# Patient Record
Sex: Male | Born: 1970
Health system: Southern US, Community
[De-identification: ages and names within clinical notes are randomized; demographics above are authoritative.]

## PROBLEM LIST (undated history)

## (undated) DIAGNOSIS — Z8619 Personal history of other infectious and parasitic diseases: Secondary | ICD-10-CM

## (undated) DIAGNOSIS — K219 Gastro-esophageal reflux disease without esophagitis: Secondary | ICD-10-CM

## (undated) DIAGNOSIS — M539 Dorsopathy, unspecified: Secondary | ICD-10-CM

## (undated) DIAGNOSIS — L409 Psoriasis, unspecified: Secondary | ICD-10-CM

## (undated) DIAGNOSIS — T884XXA Failed or difficult intubation, initial encounter: Secondary | ICD-10-CM

## (undated) DIAGNOSIS — G4733 Obstructive sleep apnea (adult) (pediatric): Secondary | ICD-10-CM

## (undated) HISTORY — DX: Personal history of other infectious and parasitic diseases: Z86.19

## (undated) HISTORY — DX: Obstructive sleep apnea (adult) (pediatric): G47.33

## (undated) HISTORY — DX: Psoriasis, unspecified: L40.9

---

## 2005-05-30 DIAGNOSIS — K921 Melena: Secondary | ICD-10-CM | POA: Insufficient documentation

## 2015-06-29 DIAGNOSIS — L400 Psoriasis vulgaris: Secondary | ICD-10-CM | POA: Diagnosis not present

## 2015-10-05 ENCOUNTER — Ambulatory Visit (INDEPENDENT_AMBULATORY_CARE_PROVIDER_SITE_OTHER): Payer: BLUE CROSS/BLUE SHIELD | Admitting: Family Medicine

## 2015-10-05 ENCOUNTER — Encounter: Payer: Self-pay | Admitting: Family Medicine

## 2015-10-05 VITALS — BP 128/78 | HR 72 | Temp 98.0°F | Resp 16 | Wt 287.0 lb

## 2015-10-05 DIAGNOSIS — L409 Psoriasis, unspecified: Secondary | ICD-10-CM | POA: Insufficient documentation

## 2015-10-05 DIAGNOSIS — M7551 Bursitis of right shoulder: Secondary | ICD-10-CM

## 2015-10-05 DIAGNOSIS — G4733 Obstructive sleep apnea (adult) (pediatric): Secondary | ICD-10-CM | POA: Insufficient documentation

## 2015-10-05 DIAGNOSIS — E669 Obesity, unspecified: Secondary | ICD-10-CM | POA: Insufficient documentation

## 2015-10-05 DIAGNOSIS — Z720 Tobacco use: Secondary | ICD-10-CM | POA: Insufficient documentation

## 2015-10-05 MED ORDER — PREDNISONE 10 MG PO TABS: ORAL_TABLET | ORAL | 0 refills | Status: AC

## 2015-10-05 NOTE — Progress Notes (Signed)
Patient: Justin [name] Male    DOB: 24-Jan-1970   45 y.o.   MRN: [id number] Visit Date: 10/05/2015  Today's Provider: Mila Merryonald Karyna Bessler, MD   Chief Complaint  Patient presents with  . Shoulder Pain    right   Subjective:    HPI Shoulder Pain: Patient complaints of right shoulder pain. This is evaluated as a unknown injury. The pain is described as aching, numbing and tingling.  The onset of the pain was sudden not related to any injury.  The pain occurs continuously.  Location is right shoulder. No history of dislocation. Symptoms are aggravated by no limitations. Symptoms are diminished by 800mg   Ibuprofen and Bio Whitman HeroFreez.   Limited activities include: no limitations. No stiffness is reported. 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 Has been hurting for 2 months. Works at [employer] but not doing any more lifting than usually.  He reports history of psoriasis which ha also flared up the last several weeks.        Allergies  Allergen Reactions  . Penicillins      Current Outpatient Prescriptions:  .  fluticasone (FLONASE) 50 MCG/ACT nasal spray, Place 2 sprays into both nostrils daily., Disp: , Rfl: 10 .  ibuprofen (ADVIL,MOTRIN) 200 MG tablet, Take 200 mg by mouth every 6 (six) hours as needed., Disp: , Rfl:   Review of Systems  Constitutional: Negative.  Negative for appetite change, chills and fever.  Respiratory: Negative for chest tightness, shortness of breath and wheezing.   Cardiovascular: Negative.  Negative for chest pain and palpitations.  Gastrointestinal: Negative for abdominal pain, nausea and vomiting.  Musculoskeletal: Positive for myalgias and neck pain. Negative for arthralgias and joint swelling.    Social History  Substance Use Topics  . Smoking status: Current Every Day Smoker    Packs/day: 1.00    Years: 16.00  . Smokeless tobacco: Never Used  . Alcohol use Yes     Comment: occasionally drinks liquor   Objective:   BP  128/78 (BP Location: Left Arm, Patient Position: Sitting, Cuff Size: Large)   Pulse 72   Temp 98 F (36.7 C) (Oral)   Resp 16   Wt 287 lb (130.2 kg)   SpO2 96%   Physical Exam   General Appearance:    Alert, cooperative, no distress  Eyes:    PERRL, conjunctiva/corneas clear, EOM's intact       Lungs:     Clear to auscultation bilaterally, respirations unlabored  Heart:    Regular rate and rhythm  Neurologic:   Awake, alert, oriented x 3. No apparent focal neurological           defect.   MS:   Diffusely tender over right shoulder lateral aspect of right upper arm and upper scapula. FROM neck and shoulder. +5/5 UE strength. Negative impingement sign.        Assessment & Plan:     1. Bursitis, shoulder, right Failed NSAIDs. Consider orthopedic referral if not improving in a few days.  - predniSONE (DELTASONE) 10 MG tablet; 6 tablets for 2 days, then 5 for 2 days, then 4 for 2 days, then 3 for 2 days, then 2 for 2 days, then 1 for 2 days.  Dispense: 42 tablet; Refill: 0     The entirety of the information documented in the History of Present Illness, Review of Systems and Physical Exam were personally obtained by me. Portions of this  information were initially documented by Rondel Baton, CMA and reviewed by me for thoroughness and accuracy.    Mila Merry, MD  [facility]

## 2015-11-30 DIAGNOSIS — L400 Psoriasis vulgaris: Secondary | ICD-10-CM | POA: Diagnosis not present

## 2016-09-20 ENCOUNTER — Ambulatory Visit (INDEPENDENT_AMBULATORY_CARE_PROVIDER_SITE_OTHER): Payer: BLUE CROSS/BLUE SHIELD | Admitting: Family Medicine

## 2016-09-20 ENCOUNTER — Encounter: Payer: Self-pay | Admitting: Family Medicine

## 2016-09-20 VITALS — BP 144/82 | HR 100 | Temp 98.5°F | Resp 16 | Wt 305.0 lb

## 2016-09-20 DIAGNOSIS — Z720 Tobacco use: Secondary | ICD-10-CM | POA: Diagnosis not present

## 2016-09-20 DIAGNOSIS — J029 Acute pharyngitis, unspecified: Secondary | ICD-10-CM | POA: Diagnosis not present

## 2016-09-20 DIAGNOSIS — J039 Acute tonsillitis, unspecified: Secondary | ICD-10-CM

## 2016-09-20 LAB — POCT RAPID STREP A (OFFICE): Rapid Strep A Screen: NEGATIVE

## 2016-09-20 MED ORDER — LEVOFLOXACIN 500 MG PO TABS: 500.0000 mg | ORAL_TABLET | Freq: Every day | ORAL | 0 refills | Status: DC

## 2016-09-20 NOTE — Progress Notes (Signed)
Note for wokrkprovided.

## 2016-09-20 NOTE — Progress Notes (Signed)
Justin [name]  MRN: [id number] DOB: [birth year]  Subjective:  HPI  Patient is here to discuss sore throat. Patient states he was feeling fine until last night. He woke up with sore/irritated throat. Lymph glands feel swollen, hurts to swallow solids but not so much with liquids. Saw some white spots in the back of his throat. No fever. No ear pain. He uses Flonase. Patient Active Problem List   Diagnosis Date Noted  . Adiposity 10/05/2015  . Obstructive apnea 10/05/2015  . Psoriasis 10/05/2015  . Current tobacco use 10/05/2015  . Blood in feces 05/30/2005    Past Medical History:  Diagnosis Date  . History of chickenpox   . Obstructive sleep apnea   . Psoriasis     Social History   Social History  . Marital status: Divorced    Spouse name: N/A  . Number of children: 1  . Years of education: N/A   Occupational History  . Not on file.   Social History Main Topics  . Smoking status: Current Every Day Smoker    Packs/day: 0.50    Years: 16.00    Types: Cigarettes  . Smokeless tobacco: Never Used  . Alcohol use No  . Drug use: No  . Sexual activity: Not on file   Other Topics Concern  . Not on file   Social History Narrative  . No narrative on file    Outpatient Encounter Prescriptions as of 09/20/2016  Medication Sig Note  . fluticasone (FLONASE) 50 MCG/ACT nasal spray Place 2 sprays into both nostrils daily. 10/05/2015: Received from: External Pharmacy  . ibuprofen (ADVIL,MOTRIN) 200 MG tablet Take 200 mg by mouth every 6 (six) hours as needed.    No facility-administered encounter medications on file as of 09/20/2016.     Allergies  Allergen Reactions  . Penicillins     Review of Systems  Constitutional: Negative.   HENT: Positive for sore throat.   Eyes: Negative.   Respiratory: Negative.   Cardiovascular: Negative.   Gastrointestinal: Negative.   Skin: Negative.   Neurological: Negative.   Endo/Heme/Allergies: Negative.     Psychiatric/Behavioral: Negative.     Objective:  BP (!) 144/82   Pulse 100   Temp 98.5 F (36.9 C)   Resp 16   Wt (!) 305 lb (138.3 kg)   Physical Exam  Constitutional: He is oriented to person, place, and time and well-developed, well-nourished, and in no distress.  HENT:  Head: Normocephalic and atraumatic.  Right Ear: External ear normal.  Left Ear: External ear normal.  Nose: Nose normal.  Mouth/Throat: Oropharyngeal exudate present.  Tonsils 3+  Cardiovascular: Normal rate, regular rhythm, normal heart sounds and intact distal pulses.  Exam reveals no gallop.   No murmur heard. Pulmonary/Chest: Effort normal and breath sounds normal. No respiratory distress. He has no wheezes.  Abdominal: Soft.  Neurological: He is alert and oriented to person, place, and time. Coordination abnormal.  Skin: Skin is warm and dry.  Psychiatric: Mood, memory, affect and judgment normal.   Assessment and Plan :  1. Tonsillitis Treat with Levaquin. Advised patient to get in touch with ENT and re disucss removal of tonsils.follow as needed. - levofloxacin (LEVAQUIN) 500 MG tablet; Take 1 tablet (500 mg total) by mouth daily.  Dispense: 5 tablet; Refill: 0 Strep is negative . Refer back to ENT. PCN allergic pt. 2. Current tobacco use  3. Sore throat Strep is negative. - POCT rapid strep A  HPI, Exam and A&P  transcribed by Domingo CockingAnastasiya Hopkins, RMA under direction and in the presence of Julieanne Mansonichard Cantrell Martus, MD. I have done the exam and reviewed the chart and it is accurate to the best of my knowledge. Dentist[employer]agon  technology has been used and  any errors in dictation or transcription are unintentional. Julieanne Mansonichard Aunna Snooks M.D. [facility]

## 2016-09-21 ENCOUNTER — Ambulatory Visit: Payer: BLUE CROSS/BLUE SHIELD | Admitting: Family Medicine

## 2016-11-15 DIAGNOSIS — R05 Cough: Secondary | ICD-10-CM | POA: Diagnosis not present

## 2016-11-15 DIAGNOSIS — J3501 Chronic tonsillitis: Secondary | ICD-10-CM | POA: Diagnosis not present

## 2016-11-15 DIAGNOSIS — G4733 Obstructive sleep apnea (adult) (pediatric): Secondary | ICD-10-CM | POA: Diagnosis not present

## 2016-11-15 DIAGNOSIS — J0141 Acute recurrent pansinusitis: Secondary | ICD-10-CM | POA: Diagnosis not present

## 2017-02-13 DIAGNOSIS — L400 Psoriasis vulgaris: Secondary | ICD-10-CM | POA: Diagnosis not present

## 2017-02-28 ENCOUNTER — Ambulatory Visit (INDEPENDENT_AMBULATORY_CARE_PROVIDER_SITE_OTHER): Payer: BLUE CROSS/BLUE SHIELD | Admitting: Family Medicine

## 2017-02-28 ENCOUNTER — Encounter: Payer: Self-pay | Admitting: Family Medicine

## 2017-02-28 VITALS — BP 128/78 | HR 79 | Temp 98.7°F | Resp 16 | Wt 308.0 lb

## 2017-02-28 DIAGNOSIS — Z720 Tobacco use: Secondary | ICD-10-CM | POA: Diagnosis not present

## 2017-02-28 DIAGNOSIS — J0110 Acute frontal sinusitis, unspecified: Secondary | ICD-10-CM

## 2017-02-28 MED ORDER — VARENICLINE TARTRATE 0.5 MG X 11 & 1 MG X 42 PO MISC: ORAL | 0 refills | Status: AC

## 2017-02-28 MED ORDER — VARENICLINE TARTRATE 1 MG PO TABS: 1.0000 mg | ORAL_TABLET | Freq: Two times a day (BID) | ORAL | 2 refills | Status: DC

## 2017-02-28 MED ORDER — AMOXICILLIN 500 MG PO CAPS: 1000.0000 mg | ORAL_CAPSULE | Freq: Two times a day (BID) | ORAL | 0 refills | Status: AC

## 2017-02-28 NOTE — Progress Notes (Signed)
Patient: Justin [name] Male    DOB: 11/08/1970   47 y.o.   MRN: [id number] Visit Date: 02/28/2017  Today's Provider: Mila Merry, MD   Chief Complaint  Patient presents with  . URI   Subjective:    URI   This is a new problem. Episode onset: 3 days ago. The problem has been unchanged. There has been no fever. Associated symptoms include congestion (nasal congestion), coughing (productive with clear- green phlegm), headaches, rhinorrhea, sinus pain, sneezing and vomiting (last night). Pertinent negatives include no abdominal pain, chest pain, diarrhea, dysuria, ear pain or sore throat. Treatments tried: Nasal rinse and Mucinex. The treatment provided no relief.   He would also like to try medications to help stop smoking. He tried nicotine gum which he states caused hiccups. He smokes 1- 1/2 packs every day. Is interested in chantix.      Allergies  Allergen Reactions  . Penicillins      Current Outpatient Medications:  .  fluticasone (FLONASE) 50 MCG/ACT nasal spray, Place 2 sprays into both nostrils daily., Disp: , Rfl: 10 .  ibuprofen (ADVIL,MOTRIN) 200 MG tablet, Take 200 mg by mouth every 6 (six) hours as needed., Disp: , Rfl:   Review of Systems  Constitutional: Negative for chills and diaphoresis.  HENT: Positive for congestion (nasal congestion), postnasal drip, rhinorrhea, sinus pressure, sinus pain and sneezing. Negative for ear pain and sore throat.   Eyes: Positive for discharge.  Respiratory: Positive for cough (productive with clear- green phlegm).   Cardiovascular: Negative for chest pain.  Gastrointestinal: Positive for vomiting (last night). Negative for abdominal pain and diarrhea.  Genitourinary: Negative for dysuria.  Neurological: Positive for headaches.    Social History   Tobacco Use  . Smoking status: Current Every Day Smoker    Packs/day: 0.50    Years: 16.00    Pack years: 8.00    Types: Cigarettes  . Smokeless tobacco: Never  Used  Substance Use Topics  . Alcohol use: No   Objective:   BP 128/78 (BP Location: Right Arm, Patient Position: Sitting, Cuff Size: Large)   Pulse 79   Temp 98.7 F (37.1 C) (Oral)   Resp 16   Wt (!) 308 lb (139.7 kg)   SpO2 95% Comment: room air There were no vitals filed for this visit.   Physical Exam  General Appearance:    Alert, cooperative, no distress  HENT:   bilateral TM normal without fluid or infection, neck without nodes, frontal sinus tender and nasal mucosa congested  Eyes:    PERRL, conjunctiva/corneas clear, EOM's intact       Lungs:     Clear to auscultation bilaterally, respirations unlabored  Heart:    Regular rate and rhythm  Neurologic:   Awake, alert, oriented x 3. No apparent focal neurological           defect.           Assessment & Plan:     1. Acute frontal sinusitis, recurrence not specified  - augmented betamethasone dipropionate (DIPROLENE-AF) 0.05 % ointment; Apply twice daily to affected areas; Refill: 2 - amoxicillin (AMOXIL) 500 MG capsule; Take 2 capsules (1,000 mg total) by mouth 2 (two) times daily for 7 days.  Dispense: 28 capsule; Refill: 0  2. Current tobacco use Counseled regarding smoking cessation. He would like to try Chantix which was prescribed.  - varenicline (CHANTIX STARTING MONTH PAK) 0.5 MG X 11 & 1 MG X  42 tablet; Take one 0.5 mg tablet daily for 3 days, then take one 0.5 mg tablet twice daily for 4 days, then take one 1 mg tablet twice daily  Dispense: 53 tablet; Refill: 0 - varenicline (CHANTIX CONTINUING MONTH PAK) 1 MG tablet; Take 1 tablet (1 mg total) by mouth 2 (two) times daily.  Dispense: 60 tablet; Refill: 2       Mila Merryonald Zara Wendt, MD  [facility]

## 2017-03-06 DIAGNOSIS — M1990 Unspecified osteoarthritis, unspecified site: Secondary | ICD-10-CM | POA: Diagnosis not present

## 2017-03-06 DIAGNOSIS — L409 Psoriasis, unspecified: Secondary | ICD-10-CM | POA: Diagnosis not present

## 2017-03-06 DIAGNOSIS — E669 Obesity, unspecified: Secondary | ICD-10-CM | POA: Diagnosis not present

## 2017-03-28 ENCOUNTER — Other Ambulatory Visit: Payer: Self-pay | Admitting: Family Medicine

## 2017-03-28 DIAGNOSIS — Z720 Tobacco use: Secondary | ICD-10-CM

## 2017-03-29 NOTE — Telephone Encounter (Signed)
Please contact pharmacy about refill request for chantix starting pack. He was prescribed starting pack on 02-28-2017. Has he taken that? If so then he will need prescription for a continuing pack, not a starting pack.

## 2017-04-03 MED ORDER — VARENICLINE TARTRATE 1 MG PO TABS: 1.0000 mg | ORAL_TABLET | Freq: Two times a day (BID) | ORAL | 2 refills | Status: DC

## 2017-04-03 NOTE — Telephone Encounter (Signed)
Called the pharmacy and the patient picked up the Rx on 03/01/17.

## 2017-04-06 ENCOUNTER — Encounter: Payer: Self-pay | Admitting: Family Medicine

## 2017-04-06 ENCOUNTER — Ambulatory Visit
Admission: RE | Admit: 2017-04-06 | Discharge: 2017-04-06 | Disposition: A | Discharge status: Home / Self Care | Payer: BLUE CROSS/BLUE SHIELD | Source: Ambulatory Visit | Attending: Family Medicine | Admitting: Family Medicine

## 2017-04-06 ENCOUNTER — Ambulatory Visit: Payer: BLUE CROSS/BLUE SHIELD | Admitting: Family Medicine

## 2017-04-06 VITALS — BP 134/86 | HR 72 | Temp 98.0°F | Resp 16 | Wt 309.6 lb

## 2017-04-06 DIAGNOSIS — M47892 Other spondylosis, cervical region: Secondary | ICD-10-CM | POA: Diagnosis not present

## 2017-04-06 DIAGNOSIS — M79602 Pain in left arm: Secondary | ICD-10-CM

## 2017-04-06 DIAGNOSIS — M542 Cervicalgia: Secondary | ICD-10-CM | POA: Diagnosis not present

## 2017-04-06 MED ORDER — PREDNISONE 10 MG PO TABS: ORAL_TABLET | ORAL | 0 refills | Status: DC

## 2017-04-06 NOTE — Progress Notes (Signed)
Patient: Justin [name] Male    DOB: 1970-10-05   46 y.o.   MRN: [id number] Visit Date: 04/06/2017  Today's Provider: Megan Mansichard Chastin Riesgo Jr, MD   Chief Complaint  Patient presents with  . Shoulder Pain   Subjective:    Shoulder Pain   The pain is present in the left shoulder. The current episode started in the past 7 days. There has been no history of extremity trauma. The problem occurs constantly. Quality: shooting. The pain is at a severity of 10/10. Associated symptoms include a limited range of motion, numbness and tingling. Pertinent negatives include no fever, inability to bear weight, itching, joint locking, joint swelling or stiffness. The symptoms are aggravated by activity and lying down. Treatments tried: Meloxicam. The treatment provided mild relief. There is no history of diabetes, gout, osteoarthritis or rheumatoid arthritis.       Allergies  Allergen Reactions  . Penicillins      Current Outpatient Medications:  .  augmented betamethasone dipropionate (DIPROLENE-AF) 0.05 % ointment, Apply twice daily to affected areas, Disp: , Rfl: 2 .  fluticasone (FLONASE) 50 MCG/ACT nasal spray, Place 2 sprays into both nostrils daily., Disp: , Rfl: 10 .  ibuprofen (ADVIL,MOTRIN) 200 MG tablet, Take 200 mg by mouth every 6 (six) hours as needed., Disp: , Rfl:  .  meloxicam (MOBIC) 7.5 MG tablet, TAKE 1 TABLET BY MOUTH DAILY IF NEEDED. TAKE WITH FOOD, Disp: , Rfl: 2 .  varenicline (CHANTIX CONTINUING MONTH PAK) 1 MG tablet, Take 1 tablet (1 mg total) by mouth 2 (two) times daily., Disp: 60 tablet, Rfl: 2  Review of Systems  Constitutional: Negative for fever.  HENT: Negative.   Eyes: Negative.   Respiratory: Negative.   Cardiovascular: Negative.   Gastrointestinal: Negative.   Endocrine: Negative.   Musculoskeletal: Negative for gout and stiffness.  Skin: Negative for itching.  Allergic/Immunologic: Negative.   Neurological: Positive for tingling and numbness.    Hematological: Negative.   Psychiatric/Behavioral: Negative.     Social History   Tobacco Use  . Smoking status: Current Every Day Smoker    Packs/day: 0.50    Years: 16.00    Pack years: 8.00    Types: Cigarettes  . Smokeless tobacco: Never Used  Substance Use Topics  . Alcohol use: No   Objective:   BP 134/86   Pulse 72   Temp 98 F (36.7 C) (Oral)   Resp 16   Wt (!) 309 lb 9.6 oz (140.4 kg)   SpO2 96%  Vitals:   04/06/17 1059  BP: 134/86  Pulse: 72  Resp: 16  Temp: 98 F (36.7 C)  TempSrc: Oral  SpO2: 96%  Weight: (!) 309 lb 9.6 oz (140.4 kg)     Physical Exam  Constitutional: He is oriented to person, place, and time. He appears well-developed and well-nourished.  HENT:  Head: Normocephalic and atraumatic.  Eyes: Conjunctivae are normal. No scleral icterus.  Neck: No thyromegaly present.  Cardiovascular: Normal rate, regular rhythm and normal heart sounds.  Pulmonary/Chest: Effort normal and breath sounds normal.  Musculoskeletal: He exhibits tenderness.  Mild tenderness over left anterior shoulder. Pain with abduction beyond 90 degrees.  Neurological: He is alert and oriented to person, place, and time. He exhibits normal muscle tone. Coordination normal.  Skin: Skin is warm and dry.  Psychiatric: He has a normal mood and affect. His behavior is normal. Judgment and thought content normal.  Assessment & Plan:     1. Pain of left upper extremity Arthropathy/tendonitis/impingement. May need ortho referral if not improving. - EKG 12-Lead - DG Cervical Spine Complete; Future - Ambulatory referral to Orthopedic Surgery      I have done the exam and reviewed the above chart and it is accurate to the best of my knowledge. Dentist has been used in this note in any air is in the dictation or transcription are unintentional.  Megan Mans, MD  [facility]

## 2017-04-07 ENCOUNTER — Telehealth: Payer: Self-pay

## 2017-04-07 NOTE — Telephone Encounter (Signed)
Pt advised.

## 2017-04-07 NOTE — Telephone Encounter (Signed)
-----   Message from Maple Hudsonichard L Gilbert Jr., MD sent at 04/07/2017  2:26 PM EDT ----- Mild DDD of neck.

## 2017-04-11 ENCOUNTER — Telehealth: Payer: Self-pay

## 2017-04-11 NOTE — Telephone Encounter (Signed)
He probably needs f/u OV. I thought it looked more shoulder than cervical DDD last visit but could be DDD. May need MRI.

## 2017-04-11 NOTE — Telephone Encounter (Signed)
Patient called saying that he still has the numbness and pain in his arm. He was seen in the office last week, and reports that he took his last dose of prednisone today. He doesn't feel that the prednisone helped much. He is requesting another round or something different to be sent into the pharmacy. He would like [facility] [place]. Thanks!

## 2017-04-11 NOTE — Telephone Encounter (Signed)
Please review

## 2017-04-11 NOTE — Telephone Encounter (Signed)
Sorry Dr Sherrie MustacheFisher, that was supposed to go to Dr Sullivan LoneGilbert.

## 2017-04-11 NOTE — Telephone Encounter (Signed)
ok 

## 2017-04-12 NOTE — Telephone Encounter (Signed)
Patient scheduled ov for 04/13/2017 at 2:00 pm.

## 2017-04-13 ENCOUNTER — Encounter: Payer: Self-pay | Admitting: Family Medicine

## 2017-04-13 ENCOUNTER — Ambulatory Visit (INDEPENDENT_AMBULATORY_CARE_PROVIDER_SITE_OTHER): Payer: BLUE CROSS/BLUE SHIELD | Admitting: Family Medicine

## 2017-04-13 VITALS — BP 140/68 | HR 69 | Temp 98.0°F | Resp 16 | Ht 62.0 in | Wt 308.0 lb

## 2017-04-13 DIAGNOSIS — M5030 Other cervical disc degeneration, unspecified cervical region: Secondary | ICD-10-CM

## 2017-04-13 DIAGNOSIS — M5412 Radiculopathy, cervical region: Secondary | ICD-10-CM | POA: Diagnosis not present

## 2017-04-13 DIAGNOSIS — Z87821 Personal history of retained foreign body fully removed: Secondary | ICD-10-CM | POA: Diagnosis not present

## 2017-04-13 DIAGNOSIS — M19012 Primary osteoarthritis, left shoulder: Secondary | ICD-10-CM

## 2017-04-13 NOTE — Progress Notes (Signed)
Patient: Justin GrillJamie L Vinciguerra Male    DOB: 17-Mar-1970   46 y.o.   MRN: [id number] Visit Date: 04/13/2017  Today's Provider: Megan Mansichard Emmalin Jaquess Jr, MD   Chief Complaint  Patient presents with  . Follow-up   Subjective:    HPI  Pain of left upper extremity From 04/06/2017-Arthropathy/tendonitis/impingement. May need ortho referral if not improving. Patient called office on 04/11/2017 stating he was still having numbness and pain in left arm. Patient was advised to office visit and possible MRI.     Allergies  Allergen Reactions  . Penicillins      Current Outpatient Medications:  .  augmented betamethasone dipropionate (DIPROLENE-AF) 0.05 % ointment, Apply twice daily to affected areas, Disp: , Rfl: 2 .  fluticasone (FLONASE) 50 MCG/ACT nasal spray, Place 2 sprays into both nostrils daily., Disp: , Rfl: 10 .  ibuprofen (ADVIL,MOTRIN) 200 MG tablet, Take 200 mg by mouth every 6 (six) hours as needed., Disp: , Rfl:  .  meloxicam (MOBIC) 7.5 MG tablet, TAKE 1 TABLET BY MOUTH DAILY IF NEEDED. TAKE WITH FOOD, Disp: , Rfl: 2 .  varenicline (CHANTIX CONTINUING MONTH PAK) 1 MG tablet, Take 1 tablet (1 mg total) by mouth 2 (two) times daily., Disp: 60 tablet, Rfl: 2 .  predniSONE (DELTASONE) 10 MG tablet, Take as directed (Patient not taking: Reported on 04/13/2017), Disp: 21 tablet, Rfl: 0  Review of Systems  Constitutional: Negative for appetite change, chills and fever.  Respiratory: Negative for chest tightness, shortness of breath and wheezing.   Cardiovascular: Negative for chest pain and palpitations.  Gastrointestinal: Negative for abdominal pain, nausea and vomiting.    Social History   Tobacco Use  . Smoking status: Current Every Day Smoker    Packs/day: 0.50    Years: 16.00    Pack years: 8.00    Types: Cigarettes  . Smokeless tobacco: Never Used  Substance Use Topics  . Alcohol use: No   Objective:   BP 140/68 (BP Location: Right Arm, Patient Position: Sitting,  Cuff Size: Large)   Pulse 69   Temp 98 F (36.7 C) (Oral)   Resp 16   Ht 5\' 2"  (1.575 m)   Wt (!) 308 lb (139.7 kg)   SpO2 98%   BMI 56.33 kg/m  Vitals:   04/13/17 1414  BP: 140/68  Pulse: 69  Resp: 16  Temp: 98 F (36.7 C)  TempSrc: Oral  SpO2: 98%  Weight: (!) 308 lb (139.7 kg)  Height: 5\' 2"  (1.575 m)     Physical Exam  Constitutional: He is oriented to person, place, and time. He appears well-developed and well-nourished.  Cardiovascular: Normal rate.  Pulmonary/Chest: Effort normal.  Neurological: He is alert and oriented to person, place, and time. He exhibits normal muscle tone. Coordination normal.  Neuro nonfocal.  Skin: Skin is warm and dry.  Psychiatric: He has a normal mood and affect. His behavior is normal. Judgment and thought content normal.        Assessment & Plan:     Shoulder Arthropathy vs Cervical DDD Has appt with Dr Martha ClanKrasinski in 1 hour.      I have done the exam and reviewed the chart and it is accurate to the best of my knowledge. DentistDragon  technology has been used and  any errors in dictation or transcription are unintentional. Julieanne Mansonichard Maleena Eddleman M.D. [facility]   Megan Mansichard Masha Orbach Jr, MD  [facility]

## 2017-04-14 ENCOUNTER — Telehealth: Payer: Self-pay | Admitting: Family Medicine

## 2017-04-14 MED ORDER — HYDROCODONE-ACETAMINOPHEN 7.5-325 MG PO TABS: 1.0000 | ORAL_TABLET | Freq: Four times a day (QID) | ORAL | 0 refills | Status: AC | PRN

## 2017-04-14 NOTE — Telephone Encounter (Signed)
Patient stated the prednisone did not help him. Patient would like to try Vicodin. Patient would like rx [facility] to [facility] [place].

## 2017-04-14 NOTE — Telephone Encounter (Signed)
Patient of Dr. Wonda OldsGilberts. Being seen for neck and shoulder pain. Going for MRI on Monday. He is in a lot of pain and asking for pain medicine please. [facility] [place]

## 2017-04-14 NOTE — Telephone Encounter (Signed)
Will send in prescription for a few vicodin. Please see if predinsone helped when he first took it. If so we can send prescription for that too.  Also, please verify pharmacy he wants us to send prescriptions to.

## 2017-05-01 DIAGNOSIS — L409 Psoriasis, unspecified: Secondary | ICD-10-CM | POA: Diagnosis not present

## 2017-05-01 DIAGNOSIS — M5030 Other cervical disc degeneration, unspecified cervical region: Secondary | ICD-10-CM | POA: Diagnosis not present

## 2017-05-01 DIAGNOSIS — E669 Obesity, unspecified: Secondary | ICD-10-CM | POA: Diagnosis not present

## 2017-05-01 DIAGNOSIS — L4050 Arthropathic psoriasis, unspecified: Secondary | ICD-10-CM | POA: Diagnosis not present

## 2017-05-23 DIAGNOSIS — L4050 Arthropathic psoriasis, unspecified: Secondary | ICD-10-CM | POA: Diagnosis not present

## 2017-07-02 ENCOUNTER — Other Ambulatory Visit: Payer: Self-pay | Admitting: Family Medicine

## 2017-07-02 DIAGNOSIS — Z720 Tobacco use: Secondary | ICD-10-CM

## 2017-07-24 DIAGNOSIS — L409 Psoriasis, unspecified: Secondary | ICD-10-CM | POA: Diagnosis not present

## 2017-07-24 DIAGNOSIS — M79674 Pain in right toe(s): Secondary | ICD-10-CM | POA: Diagnosis not present

## 2017-07-24 DIAGNOSIS — E669 Obesity, unspecified: Secondary | ICD-10-CM | POA: Diagnosis not present

## 2017-07-24 DIAGNOSIS — L4050 Arthropathic psoriasis, unspecified: Secondary | ICD-10-CM | POA: Diagnosis not present

## 2018-01-04 DIAGNOSIS — J040 Acute laryngitis: Secondary | ICD-10-CM | POA: Diagnosis not present

## 2018-01-04 DIAGNOSIS — J3501 Chronic tonsillitis: Secondary | ICD-10-CM | POA: Diagnosis not present

## 2018-01-04 DIAGNOSIS — F17200 Nicotine dependence, unspecified, uncomplicated: Secondary | ICD-10-CM | POA: Diagnosis not present

## 2018-01-25 DIAGNOSIS — J3501 Chronic tonsillitis: Secondary | ICD-10-CM | POA: Diagnosis not present

## 2018-01-25 DIAGNOSIS — R490 Dysphonia: Secondary | ICD-10-CM | POA: Diagnosis not present

## 2018-02-27 ENCOUNTER — Encounter: Payer: Self-pay | Admitting: Anesthesiology

## 2018-02-27 ENCOUNTER — Encounter: Payer: Self-pay | Admitting: *Deleted

## 2018-02-27 ENCOUNTER — Other Ambulatory Visit: Payer: Self-pay

## 2018-03-05 ENCOUNTER — Other Ambulatory Visit: Payer: Self-pay

## 2018-03-05 ENCOUNTER — Encounter
Admission: RE | Admit: 2018-03-05 | Discharge: 2018-03-05 | Disposition: A | Discharge status: Home / Self Care | Payer: BLUE CROSS/BLUE SHIELD | Source: Ambulatory Visit | Attending: Unknown Physician Specialty | Admitting: Unknown Physician Specialty

## 2018-03-05 NOTE — Patient Instructions (Signed)
Your procedure is scheduled on: 03-06-18 Report to Same Day Surgery 2nd floor medical mall [facility]-take elevator on left to 2nd floor.  Check in with surgery information desk.) To find out your arrival time please call [phone] between 1PM - 3PM on 03-05-18  Remember: Instructions that are not followed completely may result in serious medical risk, up to and including death, or upon the discretion of your surgeon and anesthesiologist your surgery may need to be rescheduled.    _x___ 1. Do not eat food after midnight the night before your procedure. You may drink clear liquids up to 2 hours before you are scheduled to arrive at the hospital for your procedure.  Do not drink clear liquids within 2 hours of your scheduled arrival to the hospital.  Clear liquids include  --Water or Apple juice without pulp  --Clear carbohydrate beverage such as ClearFast or Gatorade  --Black Coffee or Clear Tea (No milk, no creamers, do not add anything to  the coffee or Tea   ____Ensure clear carbohydrate drink on the way to the hospital for bariatric patients  ____Ensure clear carbohydrate drink 3 hours before surgery for Dr Rutherford Nail patients if physician instructed.   No gum chewing or hard candies.     __x__ 2. No Alcohol for 24 hours before or after surgery.   __x__3. No Smoking or e-cigarettes for 24 prior to surgery.  Do not use any chewable tobacco products for at least 6 hour prior to surgery   ____  4. Bring all medications with you on the day of surgery if instructed.    __x__ 5. Notify your doctor if there is any change in your medical condition     (cold, fever, infections).    x___6. On the morning of surgery brush your teeth with toothpaste and water.  You may rinse your mouth with mouth wash if you wish.  Do not swallow any toothpaste or mouthwash.   Do not wear jewelry, make-up, hairpins, clips or nail polish.  Do not wear lotions, powders, or perfumes. You may wear  deodorant.  Do not shave 48 hours prior to surgery. Men may shave face and neck.  Do not bring valuables to the hospital.    [facility] is not responsible for any belongings or valuables.               Contacts, dentures or bridgework may not be worn into surgery.  Leave your suitcase in the car. After surgery it may be brought to your room.  For patients admitted to the hospital, discharge time is determined by your treatment team.  _  Patients discharged the day of surgery will not be allowed to drive home.  You will need someone to drive you home and stay with you the night of your procedure.    Please read over the following fact sheets that you were given:   [facility] Preparing for Surgery  _x___ TAKE THE FOLLOWING MEDICATION THE MORNING OF SURGERY WITH A SMALL SIP OF WATER. These include:  1. PRILOSEC  2. TAKE A PRILOSEC TONIGHT BEFORE BED  3.  4.  5.  6.  ____Fleets enema or Magnesium Citrate as directed.   ____ Use CHG Soap or sage wipes as directed on instruction sheet   ____ Use inhalers on the day of surgery and bring to hospital day of surgery  ____ Stop Metformin and Janumet 2 days prior to surgery.    ____ Take 1/2 of usual  insulin dose the night before surgery and none on the morning surgery.   ____ Follow recommendations from Cardiologist, Pulmonologist or PCP regarding          stopping Aspirin, Coumadin, Plavix ,Eliquis, Effient, or Pradaxa, and Pletal.  X____Stop Anti-inflammatories such as Advil, Aleve, Ibuprofen, Motrin, Naproxen, Naprosyn, Goodies powders or aspirin products NOW-OK to take Tylenol    ____ Stop supplements until after surgery.     ____ Bring C-Pap to the hospital.

## 2018-03-06 ENCOUNTER — Ambulatory Visit: Payer: BLUE CROSS/BLUE SHIELD

## 2018-03-06 ENCOUNTER — Encounter: Payer: Self-pay | Admitting: *Deleted

## 2018-03-06 ENCOUNTER — Ambulatory Visit
Admission: RE | Admit: 2018-03-06 | Discharge: 2018-03-06 | Disposition: A | Discharge status: Home / Self Care | Payer: BLUE CROSS/BLUE SHIELD | Attending: Unknown Physician Specialty | Admitting: Unknown Physician Specialty

## 2018-03-06 ENCOUNTER — Other Ambulatory Visit: Payer: Self-pay

## 2018-03-06 ENCOUNTER — Encounter
Admission: RE | Disposition: A | Discharge status: Home / Self Care | Payer: Self-pay | Source: Home / Self Care | Attending: Unknown Physician Specialty

## 2018-03-06 DIAGNOSIS — G4733 Obstructive sleep apnea (adult) (pediatric): Secondary | ICD-10-CM | POA: Diagnosis not present

## 2018-03-06 DIAGNOSIS — M1990 Unspecified osteoarthritis, unspecified site: Secondary | ICD-10-CM | POA: Diagnosis not present

## 2018-03-06 DIAGNOSIS — L409 Psoriasis, unspecified: Secondary | ICD-10-CM | POA: Diagnosis not present

## 2018-03-06 DIAGNOSIS — Z79899 Other long term (current) drug therapy: Secondary | ICD-10-CM | POA: Insufficient documentation

## 2018-03-06 DIAGNOSIS — J3501 Chronic tonsillitis: Secondary | ICD-10-CM | POA: Diagnosis not present

## 2018-03-06 DIAGNOSIS — R599 Enlarged lymph nodes, unspecified: Secondary | ICD-10-CM | POA: Diagnosis not present

## 2018-03-06 DIAGNOSIS — Z6839 Body mass index (BMI) 39.0-39.9, adult: Secondary | ICD-10-CM | POA: Diagnosis not present

## 2018-03-06 DIAGNOSIS — Z8249 Family history of ischemic heart disease and other diseases of the circulatory system: Secondary | ICD-10-CM | POA: Insufficient documentation

## 2018-03-06 DIAGNOSIS — J353 Hypertrophy of tonsils with hypertrophy of adenoids: Secondary | ICD-10-CM | POA: Diagnosis not present

## 2018-03-06 DIAGNOSIS — K219 Gastro-esophageal reflux disease without esophagitis: Secondary | ICD-10-CM | POA: Diagnosis not present

## 2018-03-06 DIAGNOSIS — E669 Obesity, unspecified: Secondary | ICD-10-CM | POA: Insufficient documentation

## 2018-03-06 DIAGNOSIS — F17200 Nicotine dependence, unspecified, uncomplicated: Secondary | ICD-10-CM | POA: Diagnosis not present

## 2018-03-06 HISTORY — DX: Failed or difficult intubation, initial encounter: T88.4XXA

## 2018-03-06 HISTORY — PX: UVULOPALATOPHARYNGOPLASTY: SHX827

## 2018-03-06 HISTORY — PX: TONSILLECTOMY: SHX5217

## 2018-03-06 LAB — URINE DRUG SCREEN, QUALITATIVE (ARMC ONLY)
Amphetamines, Ur Screen: NOT DETECTED
Barbiturates, Ur Screen: NOT DETECTED
Benzodiazepine, Ur Scrn: NOT DETECTED
CANNABINOID 50 NG, UR ~~LOC~~: POSITIVE — AB
Cocaine Metabolite,Ur ~~LOC~~: NOT DETECTED
MDMA (Ecstasy)Ur Screen: NOT DETECTED
Methadone Scn, Ur: NOT DETECTED
Opiate, Ur Screen: NOT DETECTED
Phencyclidine (PCP) Ur S: NOT DETECTED
Tricyclic, Ur Screen: NOT DETECTED

## 2018-03-06 SURGERY — TONSILLECTOMY
Anesthesia: General

## 2018-03-06 MED ORDER — PROPOFOL 10 MG/ML IV BOLUS
INTRAVENOUS | Status: AC
  Filled 2018-03-06: qty 20

## 2018-03-06 MED ORDER — PROPOFOL 10 MG/ML IV BOLUS
INTRAVENOUS | Status: DC | PRN
  Administered 2018-03-06: 50 mg via INTRAVENOUS
  Administered 2018-03-06: 160 mg via INTRAVENOUS
  Administered 2018-03-06: 50 mg via INTRAVENOUS

## 2018-03-06 MED ORDER — BUPIVACAINE-EPINEPHRINE 0.5% -1:200000 IJ SOLN
INTRAMUSCULAR | Status: DC | PRN
  Administered 2018-03-06: 3 mL

## 2018-03-06 MED ORDER — MIDAZOLAM HCL 2 MG/2ML IJ SOLN
INTRAMUSCULAR | Status: AC
  Filled 2018-03-06: qty 2

## 2018-03-06 MED ORDER — FENTANYL CITRATE (PF) 100 MCG/2ML IJ SOLN
INTRAMUSCULAR | Status: DC | PRN
  Administered 2018-03-06: 100 ug via INTRAVENOUS
  Administered 2018-03-06 (×3): 50 ug via INTRAVENOUS

## 2018-03-06 MED ORDER — SUCCINYLCHOLINE CHLORIDE 20 MG/ML IJ SOLN
INTRAMUSCULAR | Status: AC
  Filled 2018-03-06: qty 1

## 2018-03-06 MED ORDER — HYDROCODONE-ACETAMINOPHEN 7.5-325 MG/15ML PO SOLN: 15.0000 mL | Freq: Four times a day (QID) | ORAL | 0 refills | Status: DC | PRN

## 2018-03-06 MED ORDER — SUCCINYLCHOLINE CHLORIDE 20 MG/ML IJ SOLN
INTRAMUSCULAR | Status: DC | PRN
  Administered 2018-03-06: 180 mg via INTRAVENOUS

## 2018-03-06 MED ORDER — ONDANSETRON HCL 4 MG/2ML IJ SOLN
INTRAMUSCULAR | Status: AC
  Filled 2018-03-06: qty 2

## 2018-03-06 MED ORDER — PROMETHAZINE HCL 25 MG/ML IJ SOLN: 6.2500 mg | INTRAMUSCULAR | Status: DC | PRN

## 2018-03-06 MED ORDER — DIPHENHYDRAMINE HCL 50 MG/ML IJ SOLN
INTRAMUSCULAR | Status: AC
  Filled 2018-03-06: qty 1

## 2018-03-06 MED ORDER — ROCURONIUM BROMIDE 100 MG/10ML IV SOLN
INTRAVENOUS | Status: DC | PRN
  Administered 2018-03-06: 40 mg via INTRAVENOUS
  Administered 2018-03-06: 10 mg via INTRAVENOUS
  Administered 2018-03-06: 20 mg via INTRAVENOUS

## 2018-03-06 MED ORDER — FENTANYL CITRATE (PF) 100 MCG/2ML IJ SOLN
INTRAMUSCULAR | Status: AC
  Administered 2018-03-06: 25 ug via INTRAVENOUS
  Filled 2018-03-06: qty 2

## 2018-03-06 MED ORDER — DEXAMETHASONE SODIUM PHOSPHATE 10 MG/ML IJ SOLN
INTRAMUSCULAR | Status: AC
  Filled 2018-03-06: qty 2

## 2018-03-06 MED ORDER — DEXAMETHASONE SODIUM PHOSPHATE 10 MG/ML IJ SOLN
INTRAMUSCULAR | Status: AC
  Filled 2018-03-06: qty 1

## 2018-03-06 MED ORDER — GLYCOPYRROLATE 0.2 MG/ML IJ SOLN
INTRAMUSCULAR | Status: DC | PRN
  Administered 2018-03-06: 0.2 mg via INTRAVENOUS

## 2018-03-06 MED ORDER — PHENYLEPHRINE HCL 10 MG/ML IJ SOLN
INTRAMUSCULAR | Status: AC
  Filled 2018-03-06: qty 1

## 2018-03-06 MED ORDER — PHENYLEPHRINE HCL 10 MG/ML IJ SOLN
INTRAMUSCULAR | Status: DC | PRN
  Administered 2018-03-06: 100 ug via INTRAVENOUS

## 2018-03-06 MED ORDER — LACTATED RINGERS IV SOLN
INTRAVENOUS | Status: DC | PRN
  Administered 2018-03-06: 11:00:00 via INTRAVENOUS

## 2018-03-06 MED ORDER — SUGAMMADEX SODIUM 200 MG/2ML IV SOLN
INTRAVENOUS | Status: DC | PRN
  Administered 2018-03-06: 300 mg via INTRAVENOUS

## 2018-03-06 MED ORDER — OXYCODONE HCL 5 MG PO TABS
ORAL_TABLET | ORAL | Status: AC
  Administered 2018-03-06: 5 mg via ORAL
  Filled 2018-03-06: qty 1

## 2018-03-06 MED ORDER — FENTANYL CITRATE (PF) 100 MCG/2ML IJ SOLN
25.0000 ug | INTRAMUSCULAR | Status: DC | PRN
  Administered 2018-03-06 (×2): 25 ug via INTRAVENOUS

## 2018-03-06 MED ORDER — MIDAZOLAM HCL 2 MG/2ML IJ SOLN
INTRAMUSCULAR | Status: DC | PRN
  Administered 2018-03-06: 2 mg via INTRAVENOUS

## 2018-03-06 MED ORDER — ONDANSETRON HCL 4 MG/2ML IJ SOLN
INTRAMUSCULAR | Status: DC | PRN
  Administered 2018-03-06: 4 mg via INTRAVENOUS

## 2018-03-06 MED ORDER — ROCURONIUM BROMIDE 50 MG/5ML IV SOLN
INTRAVENOUS | Status: AC
  Filled 2018-03-06: qty 1

## 2018-03-06 MED ORDER — MEPERIDINE HCL 50 MG/ML IJ SOLN: 6.2500 mg | INTRAMUSCULAR | Status: DC | PRN

## 2018-03-06 MED ORDER — ACETAMINOPHEN 10 MG/ML IV SOLN
1000.0000 mg | Freq: Once | INTRAVENOUS | Status: AC
  Administered 2018-03-06: 1000 mg via INTRAVENOUS

## 2018-03-06 MED ORDER — FENTANYL CITRATE (PF) 100 MCG/2ML IJ SOLN
INTRAMUSCULAR | Status: AC
  Filled 2018-03-06: qty 2

## 2018-03-06 MED ORDER — LIDOCAINE HCL (CARDIAC) PF 100 MG/5ML IV SOSY
PREFILLED_SYRINGE | INTRAVENOUS | Status: DC | PRN
  Administered 2018-03-06: 100 mg via INTRAVENOUS

## 2018-03-06 MED ORDER — ALBUTEROL SULFATE HFA 108 (90 BASE) MCG/ACT IN AERS
INHALATION_SPRAY | RESPIRATORY_TRACT | Status: DC | PRN
  Administered 2018-03-06: 8 via RESPIRATORY_TRACT

## 2018-03-06 MED ORDER — LACTATED RINGERS IV SOLN
INTRAVENOUS | Status: DC
  Administered 2018-03-06: 08:00:00 via INTRAVENOUS

## 2018-03-06 MED ORDER — OXYCODONE HCL 5 MG PO TABS
5.0000 mg | ORAL_TABLET | Freq: Once | ORAL | Status: AC | PRN
  Administered 2018-03-06: 5 mg via ORAL

## 2018-03-06 MED ORDER — ACETAMINOPHEN 10 MG/ML IV SOLN
INTRAVENOUS | Status: AC
  Filled 2018-03-06: qty 100

## 2018-03-06 MED ORDER — DEXMEDETOMIDINE HCL IN NACL 200 MCG/50ML IV SOLN
INTRAVENOUS | Status: DC | PRN
  Administered 2018-03-06 (×2): 8 ug via INTRAVENOUS
  Administered 2018-03-06 (×3): 4 ug via INTRAVENOUS

## 2018-03-06 MED ORDER — FENTANYL CITRATE (PF) 250 MCG/5ML IJ SOLN
INTRAMUSCULAR | Status: AC
  Filled 2018-03-06: qty 5

## 2018-03-06 MED ORDER — DEXAMETHASONE SODIUM PHOSPHATE 10 MG/ML IJ SOLN
INTRAMUSCULAR | Status: DC | PRN
  Administered 2018-03-06 (×2): 10 mg via INTRAVENOUS

## 2018-03-06 MED ORDER — OXYCODONE HCL 5 MG/5ML PO SOLN: 5.0000 mg | Freq: Once | ORAL | Status: AC | PRN

## 2018-03-06 MED ORDER — LIDOCAINE HCL (PF) 2 % IJ SOLN
INTRAMUSCULAR | Status: AC
  Filled 2018-03-06: qty 10

## 2018-03-06 SURGICAL SUPPLY — 20 items
BLADE SURG 15 STRL LF DISP TIS (BLADE) ×2 IMPLANT
BLADE SURG 15 STRL SS (BLADE) ×1
CANISTER SUCT 1200ML W/VALVE (MISCELLANEOUS) ×3 IMPLANT
CATH ROBINSON RED A/P 10FR (CATHETERS) ×3 IMPLANT
COAG SUCT 10F 3.5MM HAND CTRL (MISCELLANEOUS) ×3 IMPLANT
COVER WAND RF STERILE (DRAPES) ×3 IMPLANT
ELECT CAUTERY BLADE TIP 2.5 (TIP) ×3
ELECT REM PT RETURN 9FT ADLT (ELECTROSURGICAL) ×3
ELECTRODE CAUTERY BLDE TIP 2.5 (TIP) ×2 IMPLANT
ELECTRODE REM PT RTRN 9FT ADLT (ELECTROSURGICAL) ×2 IMPLANT
GLOVE BIO SURGEON STRL SZ7.5 (GLOVE) ×3 IMPLANT
GOWN STRL REUS W/ TWL LRG LVL3 (GOWN DISPOSABLE) ×4 IMPLANT
GOWN STRL REUS W/TWL LRG LVL3 (GOWN DISPOSABLE) ×2
HANDLE SUCTION POOLE (INSTRUMENTS) ×2 IMPLANT
LABEL OR SOLS (LABEL) ×3 IMPLANT
NS IRRIG 500ML POUR BTL (IV SOLUTION) ×3 IMPLANT
PACK HEAD/NECK (MISCELLANEOUS) ×3 IMPLANT
SPONGE TONSIL .75 RFD DBL STRL (DISPOSABLE) ×3 IMPLANT
SUCTION POOLE HANDLE (INSTRUMENTS) ×3
SUT VIC AB 4-0 RB1 18 (SUTURE) ×3 IMPLANT

## 2018-03-06 NOTE — Anesthesia Postprocedure Evaluation (Signed)
Anesthesia Post Note  Patient: Justin [name]  Procedure(s) Performed: TONSILLECTOMY (Bilateral ) UVULOPALATOPHARYNGOPLASTY (UPPP) (N/A )  Patient location during evaluation: PACU Anesthesia Type: General Level of consciousness: awake and alert and oriented Pain management: pain level controlled Vital Signs Assessment: post-procedure vital signs reviewed and stable Respiratory status: spontaneous breathing, nonlabored ventilation and respiratory function stable Cardiovascular status: blood pressure returned to baseline and stable Postop Assessment: no signs of nausea or vomiting Anesthetic complications: no     Last Vitals:  Vitals:   03/06/18 1348 03/06/18 1441  BP: 132/62 130/64  Pulse: (!) 57 70  Resp: 18 16  Temp: (!) 36.1 C   SpO2: 100% 95%    Last Pain:  Vitals:   03/06/18 1441  TempSrc:   PainSc: 7                  Avagrace Botelho

## 2018-03-06 NOTE — Anesthesia Procedure Notes (Signed)
Procedure Name: Intubation Date/Time: 03/06/2018 9:15 AM Performed by: Alver Fisher, MD Pre-anesthesia Checklist: Patient identified, Suction available, Patient being monitored, Timeout performed and Emergency Drugs available Patient Re-evaluated:Patient Re-evaluated prior to induction Oxygen Delivery Method: Circle system utilized Preoxygenation: Pre-oxygenation with 100% oxygen Induction Type: IV induction Ventilation: Two handed mask ventilation required and Mask ventilation with difficulty Laryngoscope Size: McGraph and 4 Grade View: Grade III Tube type: Oral Tube size: 7.5 mm Number of attempts: 1 Airway Equipment and Method: Stylet Placement Confirmation: ETT inserted through vocal cords under direct vision,  positive ETCO2 and breath sounds checked- equal and bilateral Secured at: 23 cm Tube secured with: Tape Dental Injury: Teeth and Oropharynx as per pre-operative assessment  Future Recommendations: Recommend- induction with short-acting agent, and alternative techniques readily available

## 2018-03-06 NOTE — Anesthesia Preprocedure Evaluation (Signed)
Anesthesia Evaluation  Patient identified by MRN, date of birth, ID band Patient awake    Reviewed: Allergy & Precautions, NPO status , Patient's Chart, lab work & pertinent test results  History of Anesthesia Complications Negative for: history of anesthetic complications  Airway Mallampati: III  TM Distance: >3 FB Neck ROM: Full    Dental no notable dental hx.    Pulmonary sleep apnea (does not wear CPAP due to recurrent infections) , neg COPD, Current Smoker,    breath sounds clear to auscultation- rhonchi (-) wheezing      Cardiovascular Exercise Tolerance: Good (-) hypertension(-) CAD, (-) Past MI, (-) Cardiac Stents and (-) CABG  Rhythm:Regular Rate:Normal - Systolic murmurs and - Diastolic murmurs    Neuro/Psych neg Seizures negative psych ROS   GI/Hepatic Neg liver ROS, GERD  ,  Endo/Other  negative endocrine ROSneg diabetes  Renal/GU negative Renal ROS     Musculoskeletal  (+) Arthritis ,   Abdominal (+) + obese,   Peds  Hematology negative hematology ROS (+)   Anesthesia Other Findings Past Medical History: No date: GERD (gastroesophageal reflux disease) No date: History of chickenpox No date: Multilevel degenerative disc disease     Comment:  RA No date: Obstructive sleep apnea     Comment:  Sleep study over 12 yrs ago. No CPAP fo over 5 yrs -               kept getting infections No date: Psoriasis   Reproductive/Obstetrics                             Anesthesia Physical Anesthesia Plan  ASA: III  Anesthesia Plan: General   Post-op Pain Management:    Induction: Intravenous  PONV Risk Score and Plan: 0 and Ondansetron and Midazolam  Airway Management Planned: Oral ETT and Video Laryngoscope Planned  Additional Equipment:   Intra-op Plan:   Post-operative Plan: Extubation in OR  Informed Consent: I have reviewed the patients History and Physical, chart, labs  and discussed the procedure including the risks, benefits and alternatives for the proposed anesthesia with the patient or authorized representative who has indicated his/her understanding and acceptance.     Dental advisory given  Plan Discussed with: CRNA and Anesthesiologist  Anesthesia Plan Comments:         Anesthesia Quick Evaluation

## 2018-03-06 NOTE — Discharge Instructions (Signed)
Tonsillectomy, Adult, Care After °This sheet gives you information about how to care for yourself after your procedure. Your doctor may also give you more specific instructions. If you have problems or questions, contact your doctor. °Follow these instructions at home: °Eating and drinking ° °· Follow instructions from your doctor about eating and drinking. °· For many days after surgery, choose foods that are soft and cold. Examples are: °? Gelatin. °? Sherbet. °? Ice cream. °? Frozen ice pops. °· If you have an upset stomach (are nauseous), choose liquids that are cold and that you can see through (clear liquids). Examples are water and apple juice without pulp. You can try thick liquids and soft foods when you can eat without throwing up (vomiting) and without too much pain. Examples are: °? Creamed soups. °? Soft, warm cereals, such as oatmeal or hot wheat cereal. °? Milk. °? Mashed potatoes. °? Applesauce. °· Drink enough fluid to keep your pee (urine) clear or pale yellow. °Driving °· Do not drive for 24 hours if you were given a medicine to help you relax (sedative). °· Do not drive or use heavy machinery while taking prescription pain medicine or until your health care provider approves. °General instructions °· Rest. °· Keep your head raised (elevated) when lying down. °· Take medicines only as told by your doctor. These include over-the-counter medicines and prescription medicines. °· Do not use mouthwashes until your doctor says it is okay. °· Gargle only as told by your doctor. °· Stay away from people who are sick. °Contact a doctor if: °· Your pain gets worse or medicines do not help. °· You have a fever. °· You have a rash. °· You feel light-headed or you pass out (faint). °· You cannot swallow even a little liquid or spit (saliva). °· Your pee is very dark. °Get help right away if: °· You have trouble breathing. °· You bleed bright red blood from your throat. °· You throw up bright red  blood. °Summary °· Follow instructions from your doctor about what you cannot eat or drink. For many days after surgery, choose foods that are soft and cold. °· Talk with your doctor about how to keep your pain under control. This can help you rest and swallow better. °· Get help right away if you bleed bright red blood from your throat or you throw up bright red blood. °This information is not intended to replace advice given to you by your health care provider. Make sure you discuss any questions you have with your health care provider. °Document Released: 02/05/2010 Document Revised: 11/27/2015 Document Reviewed: 11/27/2015 °Elsevier Interactive Patient Education © 2019 [employer] ° °AMBULATORY SURGERY  °DISCHARGE INSTRUCTIONS ° ° °1) The drugs that you were given will stay in your system until tomorrow so for the next 24 hours you should not: ° °A) Drive an automobile °B) Make any legal decisions °C) Drink any alcoholic beverage ° ° °2) You may resume regular meals tomorrow.  Today it is better to start with liquids and gradually work up to solid foods. ° °You may eat anything you prefer, but it is better to start with liquids, then soup and crackers, and gradually work up to solid foods. ° ° °3) Please notify your doctor immediately if you have any unusual bleeding, trouble breathing, redness and pain at the surgery site, drainage, fever, or pain not relieved by medication. ° ° ° °4) Additional Instructions: ° ° °Please contact your physician with any problems or Same   with any problems or Same Day Surgery at [phone], Monday through Friday 6 am to 4 pm, or [place] at [facility] number at [phone].

## 2018-03-06 NOTE — Progress Notes (Signed)
Spitting up blood in small amt   Pt states that he is a 9 out of 10 in pain  But sleeps while not stimulated

## 2018-03-06 NOTE — Transfer of Care (Signed)
Immediate Anesthesia Transfer of Care Note  Patient: Justin [name]  Procedure(s) Performed: TONSILLECTOMY (Bilateral ) UVULOPALATOPHARYNGOPLASTY (UPPP) (N/A )  Patient Location: PACU  Anesthesia Type:General  Level of Consciousness: awake, alert  and oriented  Airway & Oxygen Therapy: Patient Spontanous Breathing and Patient connected to face mask oxygen  Post-op Assessment: Report given to RN, Post -op Vital signs reviewed and stable and Patient moving all extremities  Post vital signs: Reviewed and stable  Last Vitals:  Vitals Value Taken Time  BP 125/64 03/06/2018 11:37 AM  Temp    Pulse 103 03/06/2018 11:37 AM  Resp 20 03/06/2018 11:37 AM  SpO2 95 % 03/06/2018 11:37 AM    Last Pain:  Vitals:   03/06/18 0725  TempSrc: Oral  PainSc: 0-No pain         Complications: No apparent anesthesia complications

## 2018-03-06 NOTE — H&P (Signed)
The patient's history has been reviewed, patient examined, no change in status, stable for surgery.  Questions were answered to the patients satisfaction.  

## 2018-03-06 NOTE — Op Note (Signed)
03/06/2018  11:12 AM    Wedel, Asher Muir  [id number]   Pre-Op Dx: CHRONIC TONSILLITIS, OBSTRUCTIVE SLEEP APNEA  Post-op Dx: SAME  Proc: Tonsillectomy and uvulopalatopharyngoplasty  Surg:  Davina Poke  Anes:  GOT  EBL: 250 cc  Comp: None  Findings: Severely hypertrophied tonsils elongated uvula  Procedure: Roosevelt was identified in the holding area taken the operating room placed in supine position.  After general endotracheal anesthesia the table was turned to 45 degrees.  A mouthgag was inserted in the oral cavity. examination of the oropharynx showed very large tonsils touching in the midline; he did have a relatively small mouth which made exposure difficult.  A local anesthetic of 0.5% plain Marcaine with 1 100,000 units of epinephrine was used to inject the soft palate a total of 3 cc was used.  Beginning on the left-hand side the tonsil was grasped and gently medialized and was dissected free from the fossa using the Bovie cautery.  Multiple small bleeding areas which were cauterized using the suction cautery.  As the tonsil was peeled medially the inferior pole was identified and also divided using the Bovie cautery.  The tonsillar fossa was inspected again there were several small bleeding points which were cauterized using the suction cautery.  With the left side completed the operation then turned to the right side again the tonsil was grasped and medialized.  The Bovie cautery was used to dissect the tonsil free from its fossa and like the left-hand side there were multiple small bleeding veins which were cauterized using the suction cautery.  Inferior pole was isolated and divided using Bovie cautery.  With the tonsil removed the oral cavity or pharynx was copiously irrigated with saline any small bleeding vessels were cauterized using suction cautery.  This completed the operation turned to the uvulopalatopharyngoplasty.  A 15 blade was used to incise from the anterior  tonsillar pillar on the left to the anterior tonsillar pillar on the right the uvula and underlying muscle were excised from anterior to posterior fashion to the posterior mucosa of the posterior tonsillar pillars.  There were 2 small bleeding vessels at the base of the palate which were cauterized using the suction cautery.  4-0 Vicryl's were then used to reapproximate the mucosal edges from posterior to anterior in the midline going laterally out to the anterior and posterior tonsillar pillars of the tonsillar fossa's.  This gave excellent closure palatopharyngoplasty.  Again the pharynx was copiously irrigated no active bleeding was identified.  5 more cc of the local anesthetic were injected in the anterior and posterior tonsillar pillars bilaterally a total of 8 mL's.  The patient was then returned to anesthesia where he was extubated in the operating take care of him in stable condition.  Cultures: None  Specimen: Tonsils  Estimated blood loss: 250 cc  Dispo:   Good  Plan: Discharge to home follow-up 3 weeks  Davina Poke  03/06/2018 11:12 AM

## 2018-03-06 NOTE — OR Nursing (Signed)
Instructed family to give him a dose of tylenol 500mg  when he gets home and then the narcotic 2 hours later.

## 2018-03-06 NOTE — Anesthesia Post-op Follow-up Note (Signed)
Anesthesia QCDR form completed.        

## 2018-03-06 NOTE — Progress Notes (Signed)
Dr. Priscella Mann at bedside for update. Pt c/o 10 out of 10 pain. Resting comfortably. Per Dr. Lenise Arena,  Give 1g IV tylenol.

## 2018-03-06 NOTE — OR Nursing (Signed)
Walked to the bathroom and voided.  Talking and looks much better.  No SOB.  Eating and drinking.

## 2018-03-07 ENCOUNTER — Encounter: Payer: Self-pay | Admitting: Family Medicine

## 2018-03-07 ENCOUNTER — Ambulatory Visit: Admit: 2018-03-07 | Payer: BLUE CROSS/BLUE SHIELD | Admitting: Unknown Physician Specialty

## 2018-03-07 HISTORY — DX: Dorsopathy, unspecified: M53.9

## 2018-03-07 HISTORY — DX: Gastro-esophageal reflux disease without esophagitis: K21.9

## 2018-03-07 LAB — SURGICAL PATHOLOGY

## 2018-03-07 SURGERY — TONSILLECTOMY
Anesthesia: General

## 2018-03-08 ENCOUNTER — Ambulatory Visit: Admit: 2018-03-08 | Payer: BLUE CROSS/BLUE SHIELD | Admitting: Otolaryngology

## 2018-03-08 ENCOUNTER — Emergency Department: Payer: BLUE CROSS/BLUE SHIELD | Admitting: Anesthesiology

## 2018-03-08 ENCOUNTER — Other Ambulatory Visit: Payer: Self-pay

## 2018-03-08 ENCOUNTER — Inpatient Hospital Stay
Admission: EM | Admit: 2018-03-08 | Discharge: 2018-03-09 | DRG: 907 | Disposition: A | Discharge status: Home / Self Care | Payer: BLUE CROSS/BLUE SHIELD | Source: Ambulatory Visit | Attending: Internal Medicine | Admitting: Internal Medicine

## 2018-03-08 ENCOUNTER — Encounter: Payer: Self-pay | Admitting: Emergency Medicine

## 2018-03-08 ENCOUNTER — Inpatient Hospital Stay: Payer: BLUE CROSS/BLUE SHIELD

## 2018-03-08 ENCOUNTER — Encounter
Admission: EM | Disposition: A | Discharge status: Home / Self Care | Payer: Self-pay | Source: Ambulatory Visit | Attending: Internal Medicine

## 2018-03-08 DIAGNOSIS — J9690 Respiratory failure, unspecified, unspecified whether with hypoxia or hypercapnia: Secondary | ICD-10-CM | POA: Diagnosis present

## 2018-03-08 DIAGNOSIS — R1111 Vomiting without nausea: Secondary | ICD-10-CM

## 2018-03-08 DIAGNOSIS — Z4682 Encounter for fitting and adjustment of non-vascular catheter: Secondary | ICD-10-CM | POA: Diagnosis not present

## 2018-03-08 DIAGNOSIS — G4733 Obstructive sleep apnea (adult) (pediatric): Secondary | ICD-10-CM | POA: Diagnosis not present

## 2018-03-08 DIAGNOSIS — R041 Hemorrhage from throat: Secondary | ICD-10-CM | POA: Diagnosis not present

## 2018-03-08 DIAGNOSIS — Y836 Removal of other organ (partial) (total) as the cause of abnormal reaction of the patient, or of later complication, without mention of misadventure at the time of the procedure: Secondary | ICD-10-CM | POA: Diagnosis present

## 2018-03-08 DIAGNOSIS — R58 Hemorrhage, not elsewhere classified: Secondary | ICD-10-CM | POA: Diagnosis not present

## 2018-03-08 DIAGNOSIS — Z9089 Acquired absence of other organs: Secondary | ICD-10-CM | POA: Diagnosis not present

## 2018-03-08 DIAGNOSIS — J9601 Acute respiratory failure with hypoxia: Secondary | ICD-10-CM | POA: Diagnosis not present

## 2018-03-08 DIAGNOSIS — J9602 Acute respiratory failure with hypercapnia: Secondary | ICD-10-CM | POA: Diagnosis not present

## 2018-03-08 DIAGNOSIS — I10 Essential (primary) hypertension: Secondary | ICD-10-CM | POA: Diagnosis not present

## 2018-03-08 DIAGNOSIS — F17210 Nicotine dependence, cigarettes, uncomplicated: Secondary | ICD-10-CM | POA: Diagnosis present

## 2018-03-08 DIAGNOSIS — J95830 Postprocedural hemorrhage and hematoma of a respiratory system organ or structure following a respiratory system procedure: Secondary | ICD-10-CM | POA: Diagnosis not present

## 2018-03-08 DIAGNOSIS — J9589 Other postprocedural complications and disorders of respiratory system, not elsewhere classified: Secondary | ICD-10-CM | POA: Diagnosis not present

## 2018-03-08 DIAGNOSIS — R1110 Vomiting, unspecified: Secondary | ICD-10-CM | POA: Diagnosis not present

## 2018-03-08 DIAGNOSIS — K219 Gastro-esophageal reflux disease without esophagitis: Secondary | ICD-10-CM | POA: Diagnosis not present

## 2018-03-08 DIAGNOSIS — R0902 Hypoxemia: Secondary | ICD-10-CM | POA: Diagnosis not present

## 2018-03-08 DIAGNOSIS — Z978 Presence of other specified devices: Secondary | ICD-10-CM

## 2018-03-08 HISTORY — PX: TONSILLECTOMY: SHX5217

## 2018-03-08 HISTORY — PX: LARYNGOSCOPY: SHX5203

## 2018-03-08 LAB — MRSA PCR SCREENING: MRSA by PCR: NEGATIVE

## 2018-03-08 LAB — BLOOD GAS, ARTERIAL
Acid-Base Excess: 4 mmol/L — ABNORMAL HIGH (ref 0.0–2.0)
Bicarbonate: 30.6 mmol/L — ABNORMAL HIGH (ref 20.0–28.0)
FIO2: 0.5
MECHANICAL RATE: 15
MECHVT: 600 mL
O2 Saturation: 97.3 %
PEEP: 5 cmH2O
Patient temperature: 37
pCO2 arterial: 53 mmHg — ABNORMAL HIGH (ref 32.0–48.0)
pH, Arterial: 7.37 (ref 7.350–7.450)
pO2, Arterial: 97 mmHg (ref 83.0–108.0)

## 2018-03-08 LAB — CBC
HEMATOCRIT: 37.4 % — AB (ref 39.0–52.0)
Hemoglobin: 12.5 g/dL — ABNORMAL LOW (ref 13.0–17.0)
MCH: 29.9 pg (ref 26.0–34.0)
MCHC: 33.4 g/dL (ref 30.0–36.0)
MCV: 89.5 fL (ref 80.0–100.0)
Platelets: 319 10*3/uL (ref 150–400)
RBC: 4.18 MIL/uL — ABNORMAL LOW (ref 4.22–5.81)
RDW: 13.4 % (ref 11.5–15.5)
WBC: 17.8 10*3/uL — AB (ref 4.0–10.5)
nRBC: 0 % (ref 0.0–0.2)

## 2018-03-08 LAB — COMPREHENSIVE METABOLIC PANEL
ALT: 13 U/L (ref 0–44)
AST: 16 U/L (ref 15–41)
Albumin: 3.6 g/dL (ref 3.5–5.0)
Alkaline Phosphatase: 53 U/L (ref 38–126)
Anion gap: 7 (ref 5–15)
BUN: 20 mg/dL (ref 6–20)
CHLORIDE: 103 mmol/L (ref 98–111)
CO2: 28 mmol/L (ref 22–32)
Calcium: 8.2 mg/dL — ABNORMAL LOW (ref 8.9–10.3)
Creatinine, Ser: 0.93 mg/dL (ref 0.61–1.24)
GFR calc Af Amer: >60 mL/min (ref >60)
GFR calc non Af Amer: >60 mL/min (ref >60)
Glucose, Bld: 107 mg/dL — ABNORMAL HIGH (ref 70–99)
POTASSIUM: 3.6 mmol/L (ref 3.5–5.1)
Sodium: 138 mmol/L (ref 135–145)
Total Bilirubin: 0.7 mg/dL (ref 0.3–1.2)
Total Protein: 6.7 g/dL (ref 6.5–8.1)

## 2018-03-08 LAB — TYPE AND SCREEN
ABO/RH(D): O NEG
Antibody Screen: NEGATIVE

## 2018-03-08 LAB — PROTIME-INR
INR: 1.04
Prothrombin Time: 13.5 seconds (ref 11.4–15.2)

## 2018-03-08 LAB — TRIGLYCERIDES: TRIGLYCERIDES: 126 mg/dL (ref <150)

## 2018-03-08 LAB — APTT: aPTT: 32 seconds (ref 24–36)

## 2018-03-08 SURGERY — TONSILLECTOMY
Anesthesia: General | Site: Throat | Laterality: Bilateral

## 2018-03-08 MED ORDER — SODIUM CHLORIDE 0.9% FLUSH: 3.0000 mL | INTRAVENOUS | Status: DC | PRN

## 2018-03-08 MED ORDER — DEXAMETHASONE SODIUM PHOSPHATE 4 MG/ML IJ SOLN
INTRAMUSCULAR | Status: AC
  Filled 2018-03-08: qty 2

## 2018-03-08 MED ORDER — METOPROLOL TARTRATE 5 MG/5ML IV SOLN
INTRAVENOUS | Status: DC | PRN
  Administered 2018-03-08: 5 mg via INTRAVENOUS

## 2018-03-08 MED ORDER — MIDAZOLAM HCL 2 MG/2ML IJ SOLN
INTRAMUSCULAR | Status: DC | PRN
  Administered 2018-03-08: 2 mg via INTRAVENOUS
  Administered 2018-03-08: 5 mg via INTRAVENOUS

## 2018-03-08 MED ORDER — ORAL CARE MOUTH RINSE
15.0000 mL | OROMUCOSAL | Status: DC
  Administered 2018-03-09 (×4): 15 mL via OROMUCOSAL

## 2018-03-08 MED ORDER — SEVOFLURANE IN SOLN
RESPIRATORY_TRACT | Status: AC
  Filled 2018-03-08: qty 250

## 2018-03-08 MED ORDER — SODIUM CHLORIDE 0.9 % IV SOLN
INTRAVENOUS | Status: DC | PRN
  Administered 2018-03-08: 17:00:00 via INTRAVENOUS

## 2018-03-08 MED ORDER — FENTANYL CITRATE (PF) 100 MCG/2ML IJ SOLN
INTRAMUSCULAR | Status: DC | PRN
  Administered 2018-03-08: 100 ug via INTRAVENOUS

## 2018-03-08 MED ORDER — DEXAMETHASONE SODIUM PHOSPHATE 10 MG/ML IJ SOLN
INTRAMUSCULAR | Status: DC | PRN
  Administered 2018-03-08: 8 mg via INTRAVENOUS

## 2018-03-08 MED ORDER — LIDOCAINE HCL (CARDIAC) PF 100 MG/5ML IV SOSY
PREFILLED_SYRINGE | INTRAVENOUS | Status: DC | PRN
  Administered 2018-03-08: 100 mg via INTRAVENOUS

## 2018-03-08 MED ORDER — PROPOFOL 1000 MG/100ML IV EMUL
5.0000 ug/kg/min | INTRAVENOUS | Status: DC
  Administered 2018-03-08: 5 ug/kg/min via INTRAVENOUS

## 2018-03-08 MED ORDER — LABETALOL HCL 5 MG/ML IV SOLN: INTRAVENOUS | Status: DC | PRN

## 2018-03-08 MED ORDER — SODIUM CHLORIDE 0.9% FLUSH
3.0000 mL | Freq: Two times a day (BID) | INTRAVENOUS | Status: DC
  Administered 2018-03-08 – 2018-03-09 (×2): 3 mL via INTRAVENOUS

## 2018-03-08 MED ORDER — DEXAMETHASONE SODIUM PHOSPHATE 4 MG/ML IJ SOLN
10.0000 mg | Freq: Four times a day (QID) | INTRAMUSCULAR | Status: DC
  Administered 2018-03-09 (×3): 10 mg via INTRAVENOUS
  Filled 2018-03-08: qty 3
  Filled 2018-03-08: qty 1
  Filled 2018-03-08 (×2): qty 3
  Filled 2018-03-08: qty 1

## 2018-03-08 MED ORDER — PROPOFOL 500 MG/50ML IV EMUL
INTRAVENOUS | Status: AC
  Filled 2018-03-08: qty 50

## 2018-03-08 MED ORDER — PROPOFOL 10 MG/ML IV BOLUS
INTRAVENOUS | Status: DC | PRN
  Administered 2018-03-08: 260 mg via INTRAVENOUS
  Administered 2018-03-08: 150 mg via INTRAVENOUS

## 2018-03-08 MED ORDER — FENTANYL CITRATE (PF) 100 MCG/2ML IJ SOLN
INTRAMUSCULAR | Status: AC
  Filled 2018-03-08: qty 2

## 2018-03-08 MED ORDER — ONDANSETRON HCL 4 MG/2ML IJ SOLN: 4.0000 mg | Freq: Four times a day (QID) | INTRAMUSCULAR | Status: DC | PRN

## 2018-03-08 MED ORDER — CHLORHEXIDINE GLUCONATE 0.12% ORAL RINSE (MEDLINE KIT)
15.0000 mL | Freq: Two times a day (BID) | OROMUCOSAL | Status: DC
  Administered 2018-03-08: 15 mL via OROMUCOSAL

## 2018-03-08 MED ORDER — FAMOTIDINE IN NACL 20-0.9 MG/50ML-% IV SOLN
20.0000 mg | Freq: Two times a day (BID) | INTRAVENOUS | Status: DC
  Filled 2018-03-08: qty 50

## 2018-03-08 MED ORDER — LABETALOL HCL 5 MG/ML IV SOLN
INTRAVENOUS | Status: DC | PRN
  Administered 2018-03-08: 10 mg via INTRAVENOUS

## 2018-03-08 MED ORDER — FAMOTIDINE IN NACL 20-0.9 MG/50ML-% IV SOLN
20.0000 mg | Freq: Two times a day (BID) | INTRAVENOUS | Status: DC
  Administered 2018-03-08 – 2018-03-09 (×2): 20 mg via INTRAVENOUS
  Filled 2018-03-08: qty 50

## 2018-03-08 MED ORDER — ROCURONIUM BROMIDE 100 MG/10ML IV SOLN
INTRAVENOUS | Status: DC | PRN
  Administered 2018-03-08 (×4): 50 mg via INTRAVENOUS

## 2018-03-08 MED ORDER — LACTATED RINGERS IV SOLN
INTRAVENOUS | Status: DC | PRN
  Administered 2018-03-08: 16:00:00 via INTRAVENOUS

## 2018-03-08 MED ORDER — SODIUM CHLORIDE 0.9 % IV BOLUS: 1000.0000 mL | Freq: Once | INTRAVENOUS | Status: DC

## 2018-03-08 MED ORDER — FENTANYL CITRATE (PF) 100 MCG/2ML IJ SOLN
100.0000 ug | INTRAMUSCULAR | Status: DC | PRN
  Filled 2018-03-08: qty 2

## 2018-03-08 MED ORDER — ROCURONIUM BROMIDE 50 MG/5ML IV SOLN
INTRAVENOUS | Status: AC
  Filled 2018-03-08: qty 1

## 2018-03-08 MED ORDER — FENTANYL CITRATE (PF) 100 MCG/2ML IJ SOLN
100.0000 ug | INTRAMUSCULAR | Status: DC | PRN
  Administered 2018-03-08 – 2018-03-09 (×4): 100 ug via INTRAVENOUS
  Filled 2018-03-08 (×3): qty 2

## 2018-03-08 MED ORDER — MIDAZOLAM HCL 5 MG/5ML IJ SOLN
INTRAMUSCULAR | Status: AC
  Filled 2018-03-08: qty 5

## 2018-03-08 MED ORDER — SUCCINYLCHOLINE CHLORIDE 20 MG/ML IJ SOLN
INTRAMUSCULAR | Status: DC | PRN
  Administered 2018-03-08: 160 mg via INTRAVENOUS
  Administered 2018-03-08: 40 mg via INTRAVENOUS

## 2018-03-08 MED ORDER — SODIUM CHLORIDE 0.9 % IV SOLN: 250.0000 mL | INTRAVENOUS | Status: DC | PRN

## 2018-03-08 MED ORDER — ACETAMINOPHEN 325 MG PO TABS: 650.0000 mg | ORAL_TABLET | ORAL | Status: DC | PRN

## 2018-03-08 MED ORDER — MIDAZOLAM HCL 2 MG/2ML IJ SOLN
INTRAMUSCULAR | Status: AC
  Filled 2018-03-08: qty 2

## 2018-03-08 MED ORDER — PROPOFOL 1000 MG/100ML IV EMUL
0.0000 ug/kg/min | INTRAVENOUS | Status: DC
  Administered 2018-03-08: 40 ug/kg/min via INTRAVENOUS
  Administered 2018-03-08: 30 ug/kg/min via INTRAVENOUS
  Administered 2018-03-09: 25 ug/kg/min via INTRAVENOUS
  Administered 2018-03-09: 30 ug/kg/min via INTRAVENOUS
  Filled 2018-03-08 (×3): qty 100

## 2018-03-08 SURGICAL SUPPLY — 18 items
BLADE BOVIE TIP EXT 4 (BLADE) ×3 IMPLANT
CANISTER SUCT 1200ML W/VALVE (MISCELLANEOUS) ×3 IMPLANT
CATH ROBINSON RED A/P 10FR (CATHETERS) IMPLANT
CATH ROBINSON RED A/P 14FR (CATHETERS) IMPLANT
COAG SUCT 10F 3.5MM HAND CTRL (MISCELLANEOUS) ×3 IMPLANT
COVER WAND RF STERILE (DRAPES) IMPLANT
ELECT REM PT RETURN 9FT ADLT (ELECTROSURGICAL) ×3
ELECTRODE REM PT RTRN 9FT ADLT (ELECTROSURGICAL) ×2 IMPLANT
GLOVE BIOGEL M STRL SZ7.5 (GLOVE) ×3 IMPLANT
HANDLE SUCTION POOLE (INSTRUMENTS) ×2 IMPLANT
KIT TURNOVER KIT A (KITS) ×3 IMPLANT
L-HOOK LAP DISP 36CM (ELECTROSURGICAL) ×3
LHOOK LAP DISP 36CM (ELECTROSURGICAL) ×2 IMPLANT
NS IRRIG 500ML POUR BTL (IV SOLUTION) ×3 IMPLANT
PACK HEAD/NECK (MISCELLANEOUS) ×3 IMPLANT
SPONGE TONSIL SM 30040 (SPONGE) ×6 IMPLANT
SUCTION POOLE HANDLE (INSTRUMENTS) ×3
SYR 3ML LL SCALE MARK (SYRINGE) ×3 IMPLANT

## 2018-03-08 NOTE — Anesthesia Preprocedure Evaluation (Signed)
Anesthesia Evaluation  Patient identified by MRN, date of birth, ID band Patient awake    Reviewed: Allergy & Precautions, NPO status , Patient's Chart, lab work & pertinent test results  History of Anesthesia Complications Negative for: history of anesthetic complications  Airway Mallampati: III  TM Distance: >3 FB Neck ROM: Full    Dental no notable dental hx.    Pulmonary sleep apnea (does not wear CPAP due to recurrent infections) , neg COPD, Current Smoker,    breath sounds clear to auscultation- rhonchi (-) wheezing      Cardiovascular Exercise Tolerance: Good (-) hypertension(-) CAD, (-) Past MI, (-) Cardiac Stents and (-) CABG  Rhythm:Regular Rate:Normal - Systolic murmurs and - Diastolic murmurs    Neuro/Psych neg Seizures negative psych ROS   GI/Hepatic Neg liver ROS, GERD  ,  Endo/Other  negative endocrine ROSneg diabetes  Renal/GU negative Renal ROS     Musculoskeletal  (+) Arthritis ,   Abdominal (+) + obese,   Peds  Hematology negative hematology ROS (+)   Anesthesia Other Findings Past Medical History: No date: GERD (gastroesophageal reflux disease) No date: History of chickenpox No date: Multilevel degenerative disc disease     Comment:  RA No date: Obstructive sleep apnea     Comment:  Sleep study over 12 yrs ago. No CPAP fo over 5 yrs -               kept getting infections No date: Psoriasis   Reproductive/Obstetrics                             Anesthesia Physical  Anesthesia Plan  ASA: III and emergent  Anesthesia Plan: General   Post-op Pain Management:    Induction: Intravenous, Rapid sequence and Cricoid pressure planned  PONV Risk Score and Plan: 0 and Ondansetron and Midazolam  Airway Management Planned: Oral ETT and Video Laryngoscope Planned  Additional Equipment:   Intra-op Plan:   Post-operative Plan: Extubation in OR  Informed Consent: I  have reviewed the patients History and Physical, chart, labs and discussed the procedure including the risks, benefits and alternatives for the proposed anesthesia with the patient or authorized representative who has indicated his/her understanding and acceptance.     Dental advisory given  Plan Discussed with: CRNA and Anesthesiologist  Anesthesia Plan Comments:         Anesthesia Quick Evaluation

## 2018-03-08 NOTE — ED Provider Notes (Signed)
[facility] Emergency Department Provider Note  ____________________________________________  Time seen: Approximately 3:48 PM  I have reviewed the triage vital signs and the nursing notes.   HISTORY  Chief Complaint No chief complaint on file.    HPI Justin [name] is a 48 y.o. male status post tonsillectomy and uvulopalatopharyngoplasty 03/06/18 18 with postoperative bleeding.  The patient was seen by Dr. Andee Poles in his ENT clinic and had a large bout of emesis with a significant amount of blood that was unable to be controlled in the office setting.  He was brought over to the emergency department and will go to the operating room.  At this time, the patient continues to spit up a small amount of blood and occasional clots but he is not having any lightheadedness, diaphoresis or syncope.  He is not on any blood thinners, and does not drink alcohol.  Dr. Andee Poles reports that he did have a fair amount of bleeding intraoperatively, per report.  Past Medical History:  Diagnosis Date  . Difficult intubation    small mouth opening, short thyromental distance, difficult mask ventilation w/ OPA  . GERD (gastroesophageal reflux disease)   . History of chickenpox   . Multilevel degenerative disc disease    RA  . Obstructive sleep apnea    Sleep study over 12 yrs ago. No CPAP fo over 5 yrs - kept getting infections  . Psoriasis     Patient Active Problem List   Diagnosis Date Noted  . Arthritis 03/06/2017  . Adiposity 10/05/2015  . Obstructive apnea 10/05/2015  . Psoriasis 10/05/2015  . Current tobacco use 10/05/2015  . Blood in feces 05/30/2005    Past Surgical History:  Procedure Laterality Date  . TONSILLECTOMY Bilateral 03/06/2018   Procedure: TONSILLECTOMY;  Surgeon: Linus Salmons, MD;  Location: [facility] ORS;  Service: ENT;  Laterality: Bilateral;  . UVULOPALATOPHARYNGOPLASTY N/A 03/06/2018   Procedure: UVULOPALATOPHARYNGOPLASTY (UPPP);  Surgeon:  Linus Salmons, MD;  Location: [facility] ORS;  Service: ENT;  Laterality: N/A;    Current Outpatient Rx  . Order #: [id number] Class: Historical Med  . Order #: [id number] Class: Normal  . Order #: [id number] Class: Print  . Order #: [id number] Class: Historical Med    Allergies Penicillins  Family History  Problem Relation Age of Onset  . Hypertension Mother     Social History Social History   Tobacco Use  . Smoking status: Current Every Day Smoker    Packs/day: 0.50    Years: 25.00    Pack years: 12.50    Types: Cigarettes  . Smokeless tobacco: Never Used  . Tobacco comment: since 1995. down to 0.5 PPD from 1 PPD  Substance Use Topics  . Alcohol use: No  . Drug use: Yes    Types: Marijuana    Comment: evenings - to help with sleep    Review of Systems Constitutional: No fever/chills.  No lightheadedness or syncope. Eyes: No visual changes. ENT: Positive bleeding from his surgical site. Cardiovascular: Denies chest pain. Denies palpitations. Respiratory: Denies shortness of breath.  No cough. Gastrointestinal: No abdominal pain.  No nausea, + vomiting.  No diarrhea.  No constipation. Genitourinary: Negative for dysuria. Musculoskeletal: Negative for back pain. Skin: Negative for rash. Neurological: Negative for headaches. No focal numbness, tingling or weakness.     ____________________________________________   PHYSICAL EXAM:  VITAL SIGNS: ED Triage Vitals  Enc Vitals Group     BP      Pulse      Resp  Temp      Temp src      SpO2      Weight      Height      Head Circumference      Peak Flow      Pain Score      Pain Loc      Pain Edu?      Excl. in GC?     Constitutional: Alert and oriented.Answers questions appropriately. Eyes: Conjunctivae are normal.  EOMI. No scleral icterus. Head: Atraumatic. Nose: No congestion/rhinnorhea. Mouth/Throat: Mucous membranes are moist.  The patient has no hoarse voice, drooling or trismus.  In the posterior  pharynx, the patient has a postsurgical suture site at the back of the palate with removal of the uvula and tonsils, with diffuse mild active bleeding without an obvious single bleeding location. Neck: No stridor.  Supple.   Cardiovascular: Normal rate, regular rhythm. No murmurs, rubs or gallops.  Respiratory: Normal respiratory effort.  No accessory muscle use or retractions. Lungs CTAB.  No wheezes, rales or ronchi. Gastrointestinal: Soft, nontender and nondistended.  No guarding or rebound.  No peritoneal signs. Musculoskeletal: No LE edema. No ttp in the calves or palpable cords.  Negative Homan's sign. Neurologic:  A&Ox3.  Speech is clear.  Face and smile are symmetric.  EOMI.  Moves all extremities well. Skin:  Skin is warm, dry and intact. No rash noted. Psychiatric: Mood and affect are normal. Speech and behavior are normal.  Normal judgement.  ____________________________________________   LABS (all labs ordered are listed, but only abnormal results are displayed)  Labs Reviewed  CBC  COMPREHENSIVE METABOLIC PANEL  PROTIME-INR  APTT  TYPE AND SCREEN   ____________________________________________  EKG  Not indicated ____________________________________________  RADIOLOGY  No results found.  ____________________________________________   PROCEDURES  Procedure(s) performed: None  Procedures  Critical Care performed: No ____________________________________________   INITIAL IMPRESSION / ASSESSMENT AND PLAN / ED COURSE  Pertinent labs & imaging results that were available during my care of the patient were reviewed by me and considered in my medical decision making (see chart for details).  48 y.o. male with postoperative bleeding after tonsillectomy and palatal uvular surgery.  The patient will plan to go to the operating room for hemostasis control.  He is not feeling nauseated at this time, and has no evidence of severe anemia but he continues to have some  mild bleeding so hemoglobin hematocrit have been ordered, as well as a type and screen.  Dr. Andee Poles and I did discuss the use of nebulized TXA, but he Dr. Gladis Riffle prefers not to administer at this time so that he can find the source of the bleeding.  If the patient has any worsening bleeding, or change in his clinical course, we will reevaluate the need for this.  The patient has been made n.p.o. and will receive IV hydration.  Plan admission.  ____________________________________________  FINAL CLINICAL IMPRESSION(S) / ED DIAGNOSES  Final diagnoses:  Bleeding  Vomiting without nausea, intractability of vomiting not specified, unspecified vomiting type         NEW MEDICATIONS STARTED DURING THIS VISIT:  New Prescriptions   No medications on file      Rockne Menghini, MD 03/08/18 1553

## 2018-03-08 NOTE — Consult Note (Signed)
Patient s/p emergent control of post-tonsillectomy hemorrhage.  Found to have arterial bleeder on inferior right tonsil.  Very difficult airway and intubation.  Due to combination of difficult airway and excessive manipulation during procedure, concern regarding extubation and decision to observe overnight with hopeful extubation tomorrow.  Will re-evaluate in a.m.

## 2018-03-08 NOTE — Anesthesia Post-op Follow-up Note (Signed)
Anesthesia QCDR form completed.        

## 2018-03-08 NOTE — ED Notes (Signed)
Pt transferred to OR by OR team at this time.

## 2018-03-08 NOTE — Transfer of Care (Signed)
Immediate Anesthesia Transfer of Care Note  Patient: Justin [name]  Procedure(s) Performed: POST OP TONSIL BLEED (Bilateral Throat)  Patient Location: ICU  Anesthesia Type:General  Level of Consciousness: sedated and Patient remains intubated per anesthesia plan  Airway & Oxygen Therapy: Patient remains intubated per anesthesia plan and Patient placed on Ventilator (see vital sign flow sheet for setting)  Post-op Assessment: Post -op Vital signs reviewed and stable  Post vital signs: stable  Last Vitals:  Vitals Value Taken Time  BP 147/89 03/08/2018  6:31 PM  Temp 36.7 C 03/08/2018  6:31 PM  Pulse 79 03/08/2018  6:31 PM  Resp 17 03/08/2018  6:31 PM  SpO2 100 % 03/08/2018  6:31 PM  Vitals shown include unvalidated device data.  Last Pain:  Vitals:   03/08/18 1831  TempSrc: Oral  PainSc:          Complications: No apparent anesthesia complications

## 2018-03-08 NOTE — Consult Note (Signed)
CRITICAL CARE NOTE  CC  Acute resp failure  SUBJECTIVE 48 yo WM admitted to ICU For resp failure and inability to protect airway S/p tonsillectomy 3 days ago complicated by bleeding and hemorrhage from surgical site  Patient evaluated by ENT In OR patient had difficile airway has some edema Extensive bleeding noted and s/p cauterization  Patient was kept intubated.   ENT REPORTS Findings:  Very difficult airway with minimal mouth opening with difficult intubation with Glyde laryngoscope and bougie having to be aborted due to clot in way of placement.  2nd attempt by anesthesia successful.  Difficult exposure of base of tongue with ultimately using Dedo laryngoscope and laparoscopic L-cautery used to cauterized arterial bleeder of right base of tongue.  Patient left intubated overnight due to concern for airway trauma and difficult intubation.  SIGNIFICANT EVENTS  BP (!) 147/89 (BP Location: Right Arm)   Pulse 80   Temp 98 F (36.7 C) (Oral)   Resp 16   Ht _0  (1.905 m)   Wt (!) 139 kg   SpO2 100%   BMI 38.30 kg/m    REVIEW OF SYSTEMS  PATIENT IS UNABLE TO PROVIDE COMPLETE REVIEW OF SYSTEMS DUE TO SEVERE CRITICAL ILLNESS   PHYSICAL EXAMINATION:  GENERAL:critically ill appearing, +resp distress HEAD: Normocephalic, atraumatic.  EYES: Pupils equal, round, reactive to light.  No scleral icterus.  MOUTH: Moist mucosal membrane. NECK: Supple. No thyromegaly. No nodules. No JVD.  PULMONARY: +rhonchi, +wheezing CARDIOVASCULAR: S1 and S2. Regular rate and rhythm. No murmurs, rubs, or gallops.  GASTROINTESTINAL: Soft, nontender, -distended. No masses. Positive bowel sounds. No hepatosplenomegaly.  MUSCULOSKELETAL: No swelling, clubbing, or edema.  NEUROLOGIC: obtunded, GCS<8 SKIN:intact,warm,dry      Indwelling Urinary Catheter continued, requirement due to   Reason to continue Indwelling Urinary Catheter for strict Intake/Output monitoring for hemodynamic instability          Ventilator continued, requirement due to, resp failure    Ventilator Sedation RASS 0 to -2     ASSESSMENT AND PLAN SYNOPSIS   Severe Hypoxic and Hypercapnic Respiratory Failure from airway edema and post tonsillectomy bleeding -continue Full MV support -continue Bronchodilator Therapy -Wean Fio2 and PEEP as tolerated -will perform SAT/SBT when respiratory parameters are met Decadron 10 mg IV every 6 hrs    NEUROLOGY - intubated and sedated - minimal sedation to achieve a RASS goal: -1 Wake up assessment pending in AM    CARDIAC ICU monitoring    GI/Nutrition GI PROPHYLAXIS as indicated DIET-->TF's as tolerated Constipation protocol as indicated  ENDO - ICU hypoglycemic\Hyperglycemia protocol -check FSBS per protocol   ELECTROLYTES -follow labs as needed -replace as needed -pharmacy consultation and following   DVT/GI PRX ordered TRANSFUSIONS AS NEEDED MONITOR FSBS ASSESS the need for LABS as needed   Critical Care Time devoted to patient care services described in this note is 35  minutes.   Overall, patient is critically ill, prognosis is guarded.    Corrin Parker, M.D.  [facility]  Medical Director [place] Director [facility] Cardio-Pulmonary Department

## 2018-03-08 NOTE — OR Nursing (Signed)
Patient held in operating room and left under anesthesia's care for 64 minutes due to lack of room availability in the critical care unit.

## 2018-03-08 NOTE — ED Triage Notes (Signed)
Pt to ER via EMS from Dr. Gregary Cromer office for post op tonsilar bleeding.  Pt to go to OR for control.

## 2018-03-08 NOTE — Anesthesia Procedure Notes (Signed)
Procedure Name: Intubation Date/Time: 03/08/2018 4:30 PM Performed by: Irving Burton, CRNA Pre-anesthesia Checklist: Patient identified, Patient being monitored, Timeout performed, Emergency Drugs available and Suction available Patient Re-evaluated:Patient Re-evaluated prior to induction Oxygen Delivery Method: Circle system utilized Preoxygenation: Pre-oxygenation with 100% oxygen Induction Type: IV induction Ventilation: Two handed mask ventilation required Laryngoscope Size: Glidescope and 4 Grade View: Grade I Tube type: Oral Tube size: 7.0 mm Number of attempts: 2 Airway Equipment and Method: Stylet Placement Confirmation: ETT inserted through vocal cords under direct vision,  positive ETCO2 and breath sounds checked- equal and bilateral Secured at: 22 cm Tube secured with: Tape Dental Injury: Teeth and Oropharynx as per pre-operative assessment  Difficulty Due To: Difficulty was anticipated and Difficult Airway- due to anterior larynx Future Recommendations: Recommend- induction with short-acting agent, and alternative techniques readily available and Recommend- awake intubation

## 2018-03-08 NOTE — Op Note (Signed)
..  03/08/2018  5:26 PM    Justin [name]  [id number]   Pre-Op Dx:  Bleeding [R58] Vomiting without nausea, intractability of vomiting not specified, unspecified vomiting type [R11.11] post-tonsillectomy bleeding  Post-op Dx: Bleeding [R58] Vomiting without nausea, intractability of vomiting not specified, unspecified vomiting type [R11.11], post-tonsillectomy bleeding  Proc: 1)  Control of Post-tonsillectomy hemorrhage - difficult  2)  Laryngoscopy   Surg: Roney Mans Cami Delawder   Assistant:  Marion Downer  Anes:  General Endotracheal  EBL:  50  Comp:  None  Findings:  Very difficult airway with minimal mouth opening with difficult intubation with Glyde laryngoscope and bougie having to be aborted due to clot in way of placement.  2nd attempt by anesthesia successful.  Difficult exposure of base of tongue with ultimately using Dedo laryngoscope and laparoscopic L-cautery used to cauterized arterial bleeder of right base of tongue.  Patient left intubated overnight due to concern for airway trauma and difficult intubation.  Procedure: After the patient was identified in the ER and the history and physical and consent was reviewed, the patient was taken to the operating room and placed in a supine position.  General endotracheal anesthesia was induced in a rapid sequence fashion due to active bleeding in his airway.  At this time, the patient was rotated 45 degrees and a shoulder roll was placed.  At this time, a McIvor mouthgag was inserted into the patient's oral cavity and suspended from the Mayo stand without injury to teeth, lips, or gums.  This demonstrated active bleeding welling up in the patient's oropharynx with large clots in the oropharynx mostly centered on right side.  These were gently removed with suction and forceps.   The left tonsillar fossa showed no large clot or active bleeding and was covered with eschar.  The right tonsillar fossa   Removal of the clots from the  right tonsillar fossa demonstrated brisk bleeding from a cleft between the large base of tongue and latera oropharyngeal wall.  Attempts at bovie suction control of this were limited due to prolapse of base of tongue over the bleeding site as well as limited mouth opening.  A 30 degree laparoscope was brought onto the field and using Bovie suction cautery and the the laparoscope, the bleeding was reduced some but still persisted more anteriorly.  At this time, Dr. Wardell Heath evaluated airway with Dedolaryngoscope and was able to evaluate the bleeding area and move the base of tongue out of the way for full view.  This demonstrated a clot that when removed revealed a small arterial pumping vessel.  This was too long for the Bovie suction cautery, so therefore a laparascopic L-cautery device was used to cauterize bleeding vessel.  This appeared to stop the active bleeding.    Decision at finishing surgery was made to keep patient intubated overnight due to concern for airway edema and difficult intubation.  Following this  The care of patient was returned to anesthesia, awakened, and transferred to ICU in stable condition  Plan:  ICU for hopeful extubation tomorrow,    Bud Face 5:26 PM 03/08/2018

## 2018-03-09 ENCOUNTER — Encounter
Admission: EM | Disposition: A | Discharge status: Home / Self Care | Payer: Self-pay | Source: Ambulatory Visit | Attending: Internal Medicine

## 2018-03-09 ENCOUNTER — Encounter: Payer: Self-pay | Admitting: Registered Nurse

## 2018-03-09 ENCOUNTER — Encounter: Payer: Self-pay | Admitting: Otolaryngology

## 2018-03-09 ENCOUNTER — Inpatient Hospital Stay: Payer: BLUE CROSS/BLUE SHIELD

## 2018-03-09 LAB — CBC WITH DIFFERENTIAL/PLATELET
Abs Immature Granulocytes: 0.1 10*3/uL — ABNORMAL HIGH (ref 0.00–0.07)
Basophils Absolute: 0.1 10*3/uL (ref 0.0–0.1)
Basophils Relative: 0 %
Eosinophils Absolute: 0 10*3/uL (ref 0.0–0.5)
Eosinophils Relative: 0 %
HCT: 40 % (ref 39.0–52.0)
Hemoglobin: 12.7 g/dL — ABNORMAL LOW (ref 13.0–17.0)
Immature Granulocytes: 1 %
Lymphocytes Relative: 13 %
Lymphs Abs: 2.4 10*3/uL (ref 0.7–4.0)
MCH: 29.7 pg (ref 26.0–34.0)
MCHC: 31.8 g/dL (ref 30.0–36.0)
MCV: 93.7 fL (ref 80.0–100.0)
MONO ABS: 0.8 10*3/uL (ref 0.1–1.0)
MONOS PCT: 4 %
Neutro Abs: 15.7 10*3/uL — ABNORMAL HIGH (ref 1.7–7.7)
Neutrophils Relative %: 82 %
Platelets: 329 10*3/uL (ref 150–400)
RBC: 4.27 MIL/uL (ref 4.22–5.81)
RDW: 13.5 % (ref 11.5–15.5)
WBC: 19.1 10*3/uL — ABNORMAL HIGH (ref 4.0–10.5)
nRBC: 0 % (ref 0.0–0.2)

## 2018-03-09 LAB — BASIC METABOLIC PANEL
Anion gap: 6 (ref 5–15)
BUN: 22 mg/dL — ABNORMAL HIGH (ref 6–20)
CO2: 26 mmol/L (ref 22–32)
CREATININE: 0.84 mg/dL (ref 0.61–1.24)
Calcium: 7.7 mg/dL — ABNORMAL LOW (ref 8.9–10.3)
Chloride: 105 mmol/L (ref 98–111)
GFR calc non Af Amer: >60 mL/min (ref >60)
Glucose, Bld: 154 mg/dL — ABNORMAL HIGH (ref 70–99)
Potassium: 4.3 mmol/L (ref 3.5–5.1)
Sodium: 137 mmol/L (ref 135–145)

## 2018-03-09 LAB — GLUCOSE, CAPILLARY: Glucose-Capillary: 97 mg/dL (ref 70–99)

## 2018-03-09 LAB — BLOOD GAS, ARTERIAL
Acid-Base Excess: 3 mmol/L — ABNORMAL HIGH (ref 0.0–2.0)
Bicarbonate: 28.5 mmol/L — ABNORMAL HIGH (ref 20.0–28.0)
FIO2: 0.4
Mechanical Rate: 15
O2 Saturation: 96.4 %
PEEP: 5 cmH2O
Patient temperature: 37
VT: 600 mL
pCO2 arterial: 46 mmHg (ref 32.0–48.0)
pH, Arterial: 7.4 (ref 7.350–7.450)
pO2, Arterial: 85 mmHg (ref 83.0–108.0)

## 2018-03-09 LAB — CBC
HCT: 35 % — ABNORMAL LOW (ref 39.0–52.0)
Hemoglobin: 11.4 g/dL — ABNORMAL LOW (ref 13.0–17.0)
MCH: 29.7 pg (ref 26.0–34.0)
MCHC: 32.6 g/dL (ref 30.0–36.0)
MCV: 91.1 fL (ref 80.0–100.0)
Platelets: 302 10*3/uL (ref 150–400)
RBC: 3.84 MIL/uL — ABNORMAL LOW (ref 4.22–5.81)
RDW: 13.2 % (ref 11.5–15.5)
WBC: 12.2 10*3/uL — AB (ref 4.0–10.5)
nRBC: 0 % (ref 0.0–0.2)

## 2018-03-09 SURGERY — REMOVAL, ENDOTRACHEAL TUBE
Anesthesia: General

## 2018-03-09 MED ORDER — ONDANSETRON HCL 4 MG PO TABS: 4.0000 mg | ORAL_TABLET | Freq: Three times a day (TID) | ORAL | 0 refills | Status: DC | PRN

## 2018-03-09 MED ORDER — PROPOFOL 10 MG/ML IV BOLUS
INTRAVENOUS | Status: AC
  Filled 2018-03-09: qty 40

## 2018-03-09 MED ORDER — OXYCODONE HCL 5 MG/5ML PO SOLN: 10.0000 mg | Freq: Four times a day (QID) | ORAL | 0 refills | Status: DC | PRN

## 2018-03-09 MED ORDER — DOCUSATE SODIUM 100 MG PO CAPS: 100.0000 mg | ORAL_CAPSULE | Freq: Two times a day (BID) | ORAL | 0 refills | Status: DC

## 2018-03-09 MED ORDER — CLINDAMYCIN HCL 300 MG PO CAPS: 300.0000 mg | ORAL_CAPSULE | Freq: Three times a day (TID) | ORAL | 0 refills | Status: DC

## 2018-03-09 MED ORDER — LIDOCAINE VISCOUS HCL 2 % MT SOLN: 10.0000 mL | Freq: Four times a day (QID) | OROMUCOSAL | 0 refills | Status: DC | PRN

## 2018-03-09 MED ORDER — SUCCINYLCHOLINE CHLORIDE 20 MG/ML IJ SOLN
INTRAMUSCULAR | Status: AC
  Filled 2018-03-09: qty 1

## 2018-03-09 SURGICAL SUPPLY — 40 items
BLADE BOVIE TIP EXT 4 (BLADE) ×2 IMPLANT
BLADE SURG 15 STRL LF DISP TIS (BLADE) ×1 IMPLANT
BLADE SURG 15 STRL SS (BLADE) ×1
BLADE SURG SZ11 CARB STEEL (BLADE) ×2 IMPLANT
CANISTER SUCT 1200ML W/VALVE (MISCELLANEOUS) ×2 IMPLANT
CATH ROBINSON RED A/P 10FR (CATHETERS) ×2 IMPLANT
CATH ROBINSON RED A/P 14FR (CATHETERS) ×2 IMPLANT
COAG SUCT 10F 3.5MM HAND CTRL (MISCELLANEOUS) ×2 IMPLANT
COVER BACK TABLE 60X90IN (DRAPES) ×2 IMPLANT
COVER WAND RF STERILE (DRAPES) ×2 IMPLANT
ELECT REM PT RETURN 9FT ADLT (ELECTROSURGICAL) ×2
ELECTRODE REM PT RTRN 9FT ADLT (ELECTROSURGICAL) ×1 IMPLANT
GLOVE BIO SURGEON STRL SZ7.5 (GLOVE) ×2 IMPLANT
GLOVE BIOGEL M STRL SZ7.5 (GLOVE) ×2 IMPLANT
GOWN STRL REUS W/ TWL LRG LVL3 (GOWN DISPOSABLE) ×2 IMPLANT
GOWN STRL REUS W/TWL LRG LVL3 (GOWN DISPOSABLE) ×2
HANDLE SUCTION POOLE (INSTRUMENTS) ×1 IMPLANT
HEMOSTAT SURGICEL 2X3 (HEMOSTASIS) IMPLANT
HLDR TRACH TUBE NECKBAND 18 (MISCELLANEOUS) ×1 IMPLANT
HOLDER TRACH TUBE NECKBAND 18 (MISCELLANEOUS) ×1
KIT TURNOVER KIT A (KITS) ×2 IMPLANT
LABEL OR SOLS (LABEL) ×2 IMPLANT
NS IRRIG 500ML POUR BTL (IV SOLUTION) ×2 IMPLANT
PACK HEAD/NECK (MISCELLANEOUS) ×2 IMPLANT
SHEARS HARMONIC 9CM CVD (BLADE) ×2 IMPLANT
SPONGE DRAIN TRACH 4X4 STRL 2S (GAUZE/BANDAGES/DRESSINGS) ×2 IMPLANT
SPONGE KITTNER 5P (MISCELLANEOUS) ×2 IMPLANT
SPONGE TONSIL SM 30040 (SPONGE) ×2 IMPLANT
SUCTION FRAZIER HANDLE 10FR (MISCELLANEOUS) ×1
SUCTION POOLE HANDLE (INSTRUMENTS) ×2
SUCTION TUBE FRAZIER 10FR DISP (MISCELLANEOUS) ×1 IMPLANT
SUT SILK 2 0 (SUTURE) ×1
SUT SILK 2 0 SH (SUTURE) ×2 IMPLANT
SUT SILK 2-0 18XBRD TIE 12 (SUTURE) ×1 IMPLANT
SUT VIC AB 3-0 PS2 18 (SUTURE) ×2 IMPLANT
SYR 10ML LL (SYRINGE) ×2 IMPLANT
SYR 3ML LL SCALE MARK (SYRINGE) ×2 IMPLANT
TOWEL OR 17X26 4PK STRL BLUE (TOWEL DISPOSABLE) ×2 IMPLANT
TUBE TRACH SHILEY  6 DIST  CUF (TUBING) ×2 IMPLANT
TUBE TRACH SHILEY 8 DIST CUF (TUBING) ×2 IMPLANT

## 2018-03-09 NOTE — Progress Notes (Signed)
Patient extubated to nasal cannula per MD order. No stridor present. Patient placed on 2L nasal cannula, oxygen saturation 95%.

## 2018-03-09 NOTE — Anesthesia Postprocedure Evaluation (Signed)
Anesthesia Post Note  Patient: Kiah Homeyer Roehrig  Procedure(s) Performed: Control of Post-tonsillectomy hemorrhage (Bilateral Throat) LARYNGOSCOPY  Patient location during evaluation: ICU Anesthesia Type: General Level of consciousness: sedated Pain management: pain level controlled Vital Signs Assessment: post-procedure vital signs reviewed and stable Respiratory status: patient on ventilator - see flowsheet for VS Cardiovascular status: stable Postop Assessment: no apparent nausea or vomiting Anesthetic complications: no     Last Vitals:  Vitals:   03/09/18 0500 03/09/18 0600  BP: 101/60 124/77  Pulse: 73 73  Resp: 16 13  Temp:    SpO2: 90% 95%    Last Pain:  Vitals:   03/09/18 0400  TempSrc: Axillary  PainSc:                  Starling Manns

## 2018-03-09 NOTE — Discharge Instructions (Signed)
Nausea and Vomiting, Adult °Nausea is feeling sick to your stomach or feeling that you are about to throw up (vomit). Vomiting is when food in your stomach is thrown up and out of the mouth. Throwing up can make you feel weak. It can also make you lose too much water in your body (get dehydrated). If you lose too much water in your body, you may: °· Feel tired. °· Feel thirsty. °· Have a dry mouth. °· Have cracked lips. °· Go pee (urinate) less often. °Older adults and people with other diseases or a weak body defense system (immune system) are at higher risk for losing too much water in the body. If you feel sick to your stomach and you throw up, it is important to follow instructions from your doctor about how to take care of yourself. °Follow these instructions at home: °Watch your symptoms for any changes. Tell your doctor about them. Follow these instructions to care for yourself at home. °Eating and drinking ° °  ° °· Take an ORS (oral rehydration solution). This is a drink that is sold at pharmacies and stores. °· Drink clear fluids in small amounts as you are able, such as: °? Water. °? Ice chips. °? Fruit juice that has water added (diluted fruit juice). °? Low-calorie sports drinks. °· Eat bland, easy-to-digest foods in small amounts as you are able, such as: °? Bananas. °? Applesauce. °? Rice. °? Low-fat (lean) meats. °? Toast. °? Crackers. °· Avoid drinking fluids that have a lot of sugar or caffeine in them. This includes energy drinks, sports drinks, and soda. °· Avoid alcohol. °· Avoid spicy or fatty foods. °General instructions °· Take over-the-counter and prescription medicines only as told by your doctor. °· Drink enough fluid to keep your pee (urine) pale yellow. °· Wash your hands often with soap and water. If you cannot use soap and water, use hand sanitizer. °· Make sure that all people in your home wash their hands well and often. °· Rest at home while you get better. °· Watch your condition  for any changes. °· Take slow and deep breaths when you feel sick to your stomach. °· Keep all follow-up visits as told by your doctor. This is important. °Contact a doctor if: °· Your symptoms get worse. °· You have new symptoms. °· You have a fever. °· You cannot drink fluids without throwing up. °· You feel sick to your stomach for more than 2 days. °· You feel light-headed or dizzy. °· You have a headache. °· You have muscle cramps. °· You have a rash. °· You have pain while peeing. °Get help right away if: °· You have pain in your chest, neck, arm, or jaw. °· You feel very weak or you pass out (faint). °· You throw up again and again. °· You have throw up that is bright red or looks like black coffee grounds. °· You have bloody or black poop (stools) or poop that looks like tar. °· You have a very bad headache, a stiff neck, or both. °· You have very bad pain, cramping, or bloating in your belly (abdomen). °· You have trouble breathing. °· You are breathing very quickly. °· Your heart is beating very quickly. °· Your skin feels cold and clammy. °· You feel confused. °· You have signs of losing too much water in your body, such as: °? Dark pee, very little pee, or no pee. °? Cracked lips. °? Dry mouth. °? Sunken eyes. °?   Sleepiness. ? Weakness. These symptoms may be an emergency. Do not wait to see if the symptoms will go away. Get medical help right away. Call your local emergency services (911 in the [place] Do not drive yourself to the hospital. Summary  Nausea is feeling sick to your stomach or feeling that you are about to throw up (vomit). Vomiting is when food in your stomach is thrown up and out of the mouth.  Follow instructions from your doctor about eating and drinking to keep from losing too much water in your body.  Take over-the-counter and prescription medicines only as told by your doctor.  Contact your doctor if your symptoms get worse or you have new symptoms.  Keep all follow-up  visits as told by your doctor. This is important. This information is not intended to replace advice given to you by your health care provider. Make sure you discuss any questions you have with your health care provider. Document Released: 06/22/2007 Document Revised: 06/13/2017 Document Reviewed: 06/13/2017 Elsevier Interactive Patient Education  2019 [employer] Inc[employer]   Outpatient Surgery, Adult An outpatient surgery is a procedure that does not require an overnight stay at a hospital or clinic. A person having an outpatient surgery can go home hours after the surgery is complete. Tell a health care provider about:  Any allergies you have.  All medicines you are taking, including vitamins, herbs, eye drops, creams, and over-the-counter medicines.  Any problems you or family members have had with anesthetic medicines.  Any blood disorders you have.  Any surgeries you have had.  Any medical conditions you have.  Any use of cigarettes, alcohol, marijuana, or street drugs.  Whether you are pregnant or may be pregnant. What are the risks? The risk and complications of surgery depend on the specific procedure. Common risks and complications include:  Infection.  Bleeding.  Allergic reactions to medicines.  Temporary increase in pain.  Failure to fix the problem that the surgery was meant to fix. What happens before the procedure? Staying hydrated Follow instructions from your health care provider about hydration, which may include:  Up to 2 hours before the procedure - you may continue to drink clear liquids, such as water, clear fruit juice, black coffee, and plain tea. Eating and drinking restrictions Follow instructions from your health care provider about eating and drinking, which may include:  8 hours before the procedure - stop eating heavy meals or foods, such as meat, fried foods, or fatty foods.  6 hours before the procedure - stop eating light meals or foods, such  as toast or cereal.  6 hours before the procedure - stop drinking milk or drinks that contain milk.  2 hours before the procedure - stop drinking clear liquids. Medicines Ask your health care provider about:  Changing or stopping your regular medicines. This is especially important if you are taking diabetes medicines or blood thinners.  Taking medicines such as aspirin and ibuprofen. These medicines can thin your blood. Do not take these medicines before the procedure if your health care provider instructs you not to.  General instructions  Do not use any tobacco products, such as cigarettes, chewing tobacco, and e-cigarettes, for as long as possible before the procedure. If you need help quitting, ask your health care provider. Quitting lowers your risk for complications during and after surgery.  Plan to have someone take you home from the hospital or clinic.  If instructed by your health care provider, bring your sleep apnea device  with you on the day of your surgery.  Plan to have a responsible adult care for you for at least 24 hours after you leave the hospital or clinic. This is important.  Ask your health care provider how your surgical site will be marked or identified.  Call your health care provider if you develop an illness or a problem that may prevent you from safely having your procedure. What happens during the procedure?  You will be settled on an operating table.  To reduce your risk of infection: ? Your health care team will wash or sanitize their hands. ? Your skin will be washed with soap. ? Hair may be removed from the surgical area.  You will be connected to heart, blood pressure, and oxygen monitors.  You may be given one or more of the following: ? A medicine to help you relax (sedative). ? A medicine to numb the area (local anesthetic). ? A medicine to make you fall asleep (general anesthetic). ? A medicine that is injected into your spine to numb  the area below and slightly above the injection site (spinal anesthetic). ? A medicine that is injected into an area of your body to numb everything below the injection site (regional anesthetic). The specifics of the procedure will depend on the type of procedure that you are having. The procedure may also vary among health care providers and hospitals. After the procedure  Your blood pressure, heart rate, breathing rate, and blood oxygen level will be monitored until the medicines you were given have worn off.  Do not drive for 24 hours if you received a sedative.  If there are no complications, you will be allowed to go home with a responsible adult when you are awake, stable, and taking fluids well.  The surgical site will feel tender.  You may feel nauseous and have some swelling, bruising, and numbness around the surgical site. Summary  An outpatient surgery is a procedure that does not require an overnight stay at a hospital or clinic. A person having an outpatient surgery can go home hours after the surgery is complete.  Follow instructions from your health care provider about eating and drinking before your surgery.  Plan to have a responsible adult care for you for at least 24 hours after you leave the hospital or clinic. This is important. This information is not intended to replace advice given to you by your health care provider. Make sure you discuss any questions you have with your health care provider. Document Released: 09/28/2000 Document Revised: 08/15/2016 Document Reviewed: 04/26/2015 Elsevier Interactive Patient Education  2019 Else[employer] Inc[employer]   Nausea and Vomiting, Adult Nausea is the feeling that you have an upset stomach or that you are about to vomit. Vomiting is when stomach contents are thrown up and out of the mouth as a result of nausea. Vomiting can make you feel weak and cause you to become dehydrated. Dehydration can make you feel tired and thirsty, cause  you to have a dry mouth, and decrease how often you urinate. Older adults and people with other diseases or a weak disease-fighting system (immune system) are at higher risk for dehydration. It is important to treat your nausea and vomiting as told by your health care provider. Follow these instructions at home: Watch your symptoms for any changes. Tell your health care provider about them. Follow these instructions to care for yourself at home. Eating and drinking      Take an oral rehydration  solution (ORS). This is a drink that is sold at pharmacies and retail stores.  Drink clear fluids slowly and in small amounts as you are able. Clear fluids include water, ice chips, low-calorie sports drinks, and fruit juice that has water added (diluted fruit juice).  Eat bland, easy-to-digest foods in small amounts as you are able. These foods include bananas, applesauce, rice, lean meats, toast, and crackers.  Avoid fluids that contain a lot of sugar or caffeine, such as energy drinks, sports drinks, and soda.  Avoid alcohol.  Avoid spicy or fatty foods. General instructions  Take over-the-counter and prescription medicines only as told by your health care provider.  Drink enough fluid to keep your urine pale yellow.  Wash your hands often using soap and water. If soap and water are not available, use hand sanitizer.  Make sure that all people in your household wash their hands well and often.  Rest at home while you recover.  Watch your condition for any changes.  Breathe slowly and deeply when you feel nauseated.  Keep all follow-up visits as told by your health care provider. This is important. Contact a health care provider if:  Your symptoms get worse.  You have new symptoms.  You have a fever.  You cannot drink fluids without vomiting.  Your nausea does not go away after 2 days.  You feel light-headed or dizzy.  You have a headache.  You have muscle cramps.  You  have a rash.  You have pain while urinating. Get help right away if:  You have pain in your chest, neck, arm, or jaw.  You feel extremely weak or you faint.  You have persistent vomiting.  You have vomit that is bright red or looks like black coffee grounds.  You have bloody or black stools or stools that look like tar.  You have a severe headache, a stiff neck, or both.  You have severe pain, cramping, or bloating in your abdomen.  You have difficulty breathing, or you are breathing very quickly.  Your heart is beating very quickly.  Your skin feels cold and clammy.  You feel confused.  You have signs of dehydration, such as: ? Dark urine, very little urine, or no urine. ? Cracked lips. ? Dry mouth. ? Sunken eyes. ? Sleepiness. ? Weakness. These symptoms may represent a serious problem that is an emergency. Do not wait to see if the symptoms will go away. Get medical help right away. Call your local emergency services (911 in the [place] Do not drive yourself to the hospital. Summary  Nausea is the feeling that you have an upset stomach or that you are about to vomit. As nausea gets worse, it can lead to vomiting. Vomiting can make you feel weak and cause you to become dehydrated.  Follow instructions from your health care provider about eating and drinking to prevent dehydration.  Take over-the-counter and prescription medicines only as told by your health care provider.  Contact your health care provider if your symptoms get worse, or you have new symptoms.  Keep all follow-up visits as told by your health care provider. This is important. This information is not intended to replace advice given to you by your health care provider. Make sure you discuss any questions you have with your health care provider. Document Released: 01/03/2005 Document Revised: 06/13/2017 Document Reviewed: 06/13/2017 Elsevier Interactive Patient Education  2019 Elsevier  Inc.   Preventing Gastrointestinal Problems During Exercise  Gastrointestinal (GI) problems are  problems with the stomach and intestines. It is common for athletes to experience GI problems during exercise. This is especially true for distance runners and triathletes. You can take actions to help prevent these problems. How can this condition affect me? GI problems can affect your performance and your ability to exercise. You may have symptoms such as:  Heartburn.  Nausea.  Vomiting.  Bloating.  Passing gas (flatulence).  Cramping.  Urge to go to the bathroom.  Rectal bleeding.  Diarrhea.  Abdominal pain. What actions can I take to lower my risk for problems? Eating and drinking   Limit fiber intake before exercise. To lower the chance of diarrhea during a competition, try reducing your intake of fiber a day and a half before.  Avoid: ? Solid foods for at least 3 hours before exercise. ? Foods and drinks that contain fat and protein during endurance exercise. ? Eating a large meal before exercise. This will lower your chance of getting abdominal pain.  Drink enough fluid to keep your urine pale yellow.  Drink water before, during, and after physical activity, even if you do not feel thirsty. Drink small amounts of water frequently throughout sporting events. Drink more water if you are exercising in hot or humid weather or in high altitudes.  If you are exercising for more than an hour, consider drinking a sports drink.  Avoid alcohol before, during, and after strenuous exercise.  Keep track of what you eat by using a food journal. This may help you identify foods that cause you to have symptoms. You can then avoid those foods. General instructions  Take over-the-counter and prescription medicines only as told by your health care provider. If you were prescribed medicines to improve your symptoms, take them exactly as told.  Work with your health care provider or a  diet and nutrition specialist (dietitian) to develop an eating plan that is right for you. This plan should help prevent symptoms and ensure that you are getting the proper nutrition for your exercise needs. Contact a health care provider if you have:  Symptoms that make it difficult for you to exercise.  Symptoms every day.  Unexplained weight loss.  Diarrhea that is not helped by diet changes.  Rectal bleeding.  Severe abdominal pain.  Night sweats.  A fever. Summary  It is common for athletes to experience gastrointestinal (GI) problems during exercise. This can affect your performance and your ability to exercise.  Drink water before, during, and after physical activity, even if you do not feel thirsty.  Contact your health care provider if your symptoms do not get better or they get worse. This information is not intended to replace advice given to you by your health care provider. Make sure you discuss any questions you have with your health care provider. Document Released: 09/28/2000 Document Revised: 08/24/2017 Document Reviewed: 01/16/2017 Elsevier Interactive Patient Education  2019 [employer].

## 2018-03-09 NOTE — Final Progress Note (Signed)
.. 03/09/2018 1:07 PM  [name] [name] Muir [id number]  Post-Op Day 1    Temp:  [98 F (36.7 C)-98.7 F (37.1 C)] 98.1 F (36.7 C) (02/21 0800) Pulse Rate:  [63-86] 66 (02/21 1100) Resp:  [8-19] 12 (02/21 1100) BP: (101-147)/(50-89) 111/50 (02/21 1100) SpO2:  [90 %-100 %] 93 % (02/21 1100) FiO2 (%):  [40 %-50 %] 40 % (02/21 0821) Weight:  [139 kg-145.6 kg] 145.6 kg (02/21 0500),     Intake/Output Summary (Last 24 hours) at 03/09/2018 1307 Last data filed at 03/09/2018 1118 Gross per 24 hour  Intake 1177.3 ml  Output 1900 ml  Net -722.7 ml    Results for orders placed or performed during the hospital encounter of 03/08/18 (from the past 24 hour(s))  CBC     Status: Abnormal   Collection Time: 03/08/18  3:59 PM  Result Value Ref Range   WBC 17.8 (H) 4.0 - 10.5 K/uL   RBC 4.18 (L) 4.22 - 5.81 MIL/uL   Hemoglobin 12.5 (L) 13.0 - 17.0 g/dL   HCT 00.9 (L) 38.1 - 82.9 %   MCV 89.5 80.0 - 100.0 fL   MCH 29.9 26.0 - 34.0 pg   MCHC 33.4 30.0 - 36.0 g/dL   RDW 93.7 16.9 - 67.8 %   Platelets 319 150 - 400 K/uL   nRBC 0.0 0.0 - 0.2 %  Comprehensive metabolic panel     Status: Abnormal   Collection Time: 03/08/18  3:59 PM  Result Value Ref Range   Sodium 138 135 - 145 mmol/L   Potassium 3.6 3.5 - 5.1 mmol/L   Chloride 103 98 - 111 mmol/L   CO2 28 22 - 32 mmol/L   Glucose, Bld 107 (H) 70 - 99 mg/dL   BUN 20 6 - 20 mg/dL   Creatinine, Ser 9.38 0.61 - 1.24 mg/dL   Calcium 8.2 (L) 8.9 - 10.3 mg/dL   Total Protein 6.7 6.5 - 8.1 g/dL   Albumin 3.6 3.5 - 5.0 g/dL   AST 16 15 - 41 U/L   ALT 13 0 - 44 U/L   Alkaline Phosphatase 53 38 - 126 U/L   Total Bilirubin 0.7 0.3 - 1.2 mg/dL   GFR calc non Af Amer >60 >60 mL/min   GFR calc Af Amer >60 >60 mL/min   Anion gap 7 5 - 15  Protime-INR     Status: None   Collection Time: 03/08/18  3:59 PM  Result Value Ref Range   Prothrombin Time 13.5 11.4 - 15.2 seconds   INR 1.04   APTT     Status: None   Collection Time: 03/08/18  3:59 PM   Result Value Ref Range   aPTT 32 24 - 36 seconds  Type and screen Ordered by PROVIDER DEFAULT     Status: None   Collection Time: 03/08/18  4:50 PM  Result Value Ref Range   ABO/RH(D) O NEG    Antibody Screen NEG    Sample Expiration      03/11/2018 Performed at [facility], [address] [place], [place] [address]   Glucose, capillary     Status: None   Collection Time: 03/08/18  6:22 PM  Result Value Ref Range   Glucose-Capillary 97 70 - 99 mg/dL  MRSA PCR Screening     Status: None   Collection Time: 03/08/18  6:42 PM  Result Value Ref Range   MRSA by PCR NEGATIVE NEGATIVE  CBC with Differential/Platelet     Status:  Abnormal   Collection Time: 03/08/18  7:55 PM  Result Value Ref Range   WBC 19.1 (H) 4.0 - 10.5 K/uL   RBC 4.27 4.22 - 5.81 MIL/uL   Hemoglobin 12.7 (L) 13.0 - 17.0 g/dL   HCT 30.0 92.3 - 30.0 %   MCV 93.7 80.0 - 100.0 fL   MCH 29.7 26.0 - 34.0 pg   MCHC 31.8 30.0 - 36.0 g/dL   RDW 76.2 26.3 - 33.5 %   Platelets 329 150 - 400 K/uL   nRBC 0.0 0.0 - 0.2 %   Neutrophils Relative % 82 %   Neutro Abs 15.7 (H) 1.7 - 7.7 K/uL   Lymphocytes Relative 13 %   Lymphs Abs 2.4 0.7 - 4.0 K/uL   Monocytes Relative 4 %   Monocytes Absolute 0.8 0.1 - 1.0 K/uL   Eosinophils Relative 0 %   Eosinophils Absolute 0.0 0.0 - 0.5 K/uL   Basophils Relative 0 %   Basophils Absolute 0.1 0.0 - 0.1 K/uL   Immature Granulocytes 1 %   Abs Immature Granulocytes 0.10 (H) 0.00 - 0.07 K/uL  Triglycerides     Status: None   Collection Time: 03/08/18  7:55 PM  Result Value Ref Range   Triglycerides 126 <150 mg/dL  Blood gas, arterial     Status: Abnormal   Collection Time: 03/08/18  8:56 PM  Result Value Ref Range   FIO2 0.50    Delivery systems VENTILATOR    Mode PRESSURE REGULATED VOLUME CONTROL    VT 600 mL   Peep/cpap 5.0 cm H20   pH, Arterial 7.37 7.350 - 7.450   pCO2 arterial 53 (H) 32.0 - 48.0 mmHg   pO2, Arterial 97 83.0 - 108.0 mmHg   Bicarbonate 30.6 (H)  20.0 - 28.0 mmol/L   Acid-Base Excess 4.0 (H) 0.0 - 2.0 mmol/L   O2 Saturation 97.3 %   Patient temperature 37.0    Collection site RIGHT RADIAL    Sample type ARTERIAL DRAW    Allens test (pass/fail) PASS PASS   Mechanical Rate 15   CBC     Status: Abnormal   Collection Time: 03/09/18  3:18 AM  Result Value Ref Range   WBC 12.2 (H) 4.0 - 10.5 K/uL   RBC 3.84 (L) 4.22 - 5.81 MIL/uL   Hemoglobin 11.4 (L) 13.0 - 17.0 g/dL   HCT 45.6 (L) 25.6 - 38.9 %   MCV 91.1 80.0 - 100.0 fL   MCH 29.7 26.0 - 34.0 pg   MCHC 32.6 30.0 - 36.0 g/dL   RDW 37.3 42.8 - 76.8 %   Platelets 302 150 - 400 K/uL   nRBC 0.0 0.0 - 0.2 %  Basic metabolic panel     Status: Abnormal   Collection Time: 03/09/18  3:18 AM  Result Value Ref Range   Sodium 137 135 - 145 mmol/L   Potassium 4.3 3.5 - 5.1 mmol/L   Chloride 105 98 - 111 mmol/L   CO2 26 22 - 32 mmol/L   Glucose, Bld 154 (H) 70 - 99 mg/dL   BUN 22 (H) 6 - 20 mg/dL   Creatinine, Ser 1.15 0.61 - 1.24 mg/dL   Calcium 7.7 (L) 8.9 - 10.3 mg/dL   GFR calc non Af Amer >60 >60 mL/min   GFR calc Af Amer >60 >60 mL/min   Anion gap 6 5 - 15  Blood gas, arterial     Status: Abnormal   Collection Time: 03/09/18  4:10 AM  Result  Value Ref Range   FIO2 0.40    Delivery systems VENTILATOR    Mode PRESSURE REGULATED VOLUME CONTROL    VT 600 mL   Peep/cpap 5.0 cm H20   pH, Arterial 7.40 7.350 - 7.450   pCO2 arterial 46 32.0 - 48.0 mmHg   pO2, Arterial 85 83.0 - 108.0 mmHg   Bicarbonate 28.5 (H) 20.0 - 28.0 mmol/L   Acid-Base Excess 3.0 (H) 0.0 - 2.0 mmol/L   O2 Saturation 96.4 %   Patient temperature 37.0    Collection site RIGHT RADIAL    Sample type ARTERIAL DRAW    Allens test (pass/fail) PASS PASS   Mechanical Rate 15     SUBJECTIVE:  No acute events overnight.  Patient tolerated sedation/intubation well.  No oral bleeding.  Following commands and communicating with writing.  OBJECTIVE:  GEN-  ETT in place and secure OC/OP-  No active bleeding with  UP3 sutures intact.  IMPRESSION:  S/p control of post-tonsillectomy hemorrhage with prolonged intubation overnight due to concerns with airway edema and difficult intubation  PLAN:  Discussed with Pulm and Anesthesia.  Patient extubated at bedside.  Initially had some old blood that was coughed up but no evidence of active bleeding.  Phonating well and tolerating PO.  Breathing well on room air.  Discussed with patient and family reagarding options.  Oropharynx clear.  If patient tolerates PO intake and continues to do well, ok to discharge home later today wiith significant limited on physical activity and soft diet including humidifier.  Patient reports previous pain medication makes him cough so will try Oxycodone and viscous lidocaine.  Will also add abx/steroids given some aspiration of blood into trachea during procedure/extubation.  Follow up with Jenne CampusMcQueen next week.  Hephzibah Strehle 03/09/2018, 1:07 PM

## 2018-03-09 NOTE — Progress Notes (Signed)
Patient currently on ventilator; able to follow commands and stay calm while intubated. On propofol for sedation with fentanyl pushes PRN for pain. No issues this shift. Vitals WNL and with adequate urine output. Family at bedside and updated regarding plan of care.

## 2018-03-10 LAB — HIV ANTIBODY (ROUTINE TESTING W REFLEX): HIV Screen 4th Generation wRfx: NONREACTIVE

## 2019-01-01 DIAGNOSIS — Z20828 Contact with and (suspected) exposure to other viral communicable diseases: Secondary | ICD-10-CM | POA: Diagnosis not present

## 2019-05-06 DIAGNOSIS — L4050 Arthropathic psoriasis, unspecified: Secondary | ICD-10-CM | POA: Diagnosis not present

## 2019-05-06 DIAGNOSIS — Z79899 Other long term (current) drug therapy: Secondary | ICD-10-CM | POA: Diagnosis not present

## 2019-05-06 DIAGNOSIS — L409 Psoriasis, unspecified: Secondary | ICD-10-CM | POA: Diagnosis not present

## 2019-05-06 DIAGNOSIS — E669 Obesity, unspecified: Secondary | ICD-10-CM | POA: Diagnosis not present

## 2019-05-15 DIAGNOSIS — J309 Allergic rhinitis, unspecified: Secondary | ICD-10-CM | POA: Diagnosis not present

## 2019-05-15 DIAGNOSIS — J45991 Cough variant asthma: Secondary | ICD-10-CM | POA: Diagnosis not present

## 2019-05-15 DIAGNOSIS — J301 Allergic rhinitis due to pollen: Secondary | ICD-10-CM | POA: Diagnosis not present

## 2019-05-15 DIAGNOSIS — J0141 Acute recurrent pansinusitis: Secondary | ICD-10-CM | POA: Diagnosis not present

## 2019-05-29 DIAGNOSIS — J0141 Acute recurrent pansinusitis: Secondary | ICD-10-CM | POA: Diagnosis not present

## 2019-05-29 DIAGNOSIS — R05 Cough: Secondary | ICD-10-CM | POA: Diagnosis not present

## 2019-07-03 IMAGING — CR DG CERVICAL SPINE COMPLETE 4+V
1 series · 7 of 7 positions shown · non-contrast
Comparison: None.

CLINICAL DATA: Left posterior neck pain extending into the
shoulder. Symptoms started 4 days ago. Pain in left upper extremity.

EXAM:
CERVICAL SPINE - COMPLETE 4+ VIEW

[Series 1: dg cervical spine complete · 0.14mm/px · 7 of 7 slices shown]
[im 1/7]
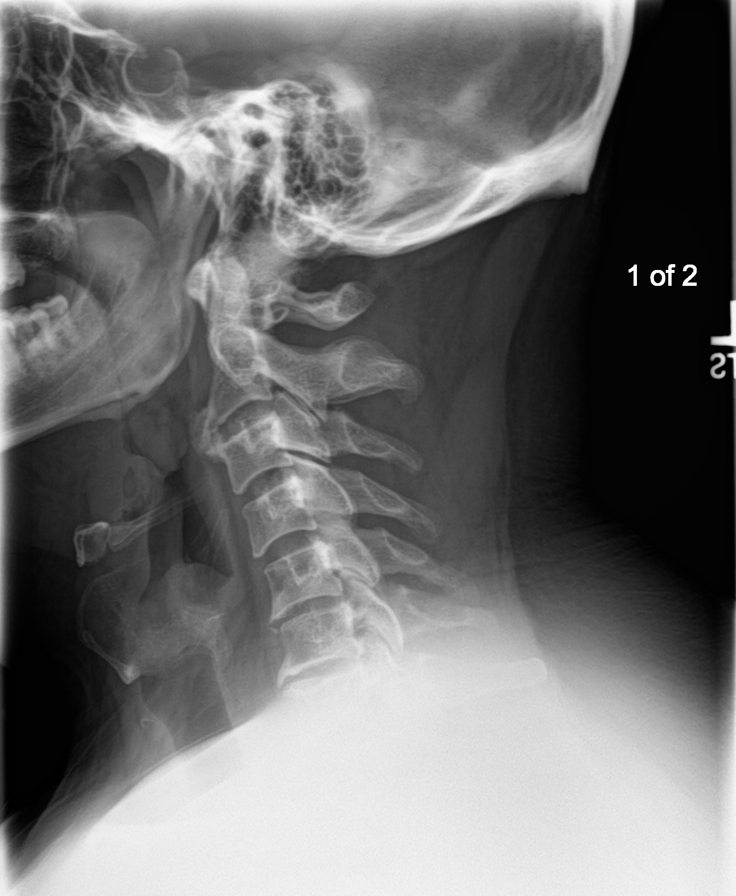
[im 2/7]
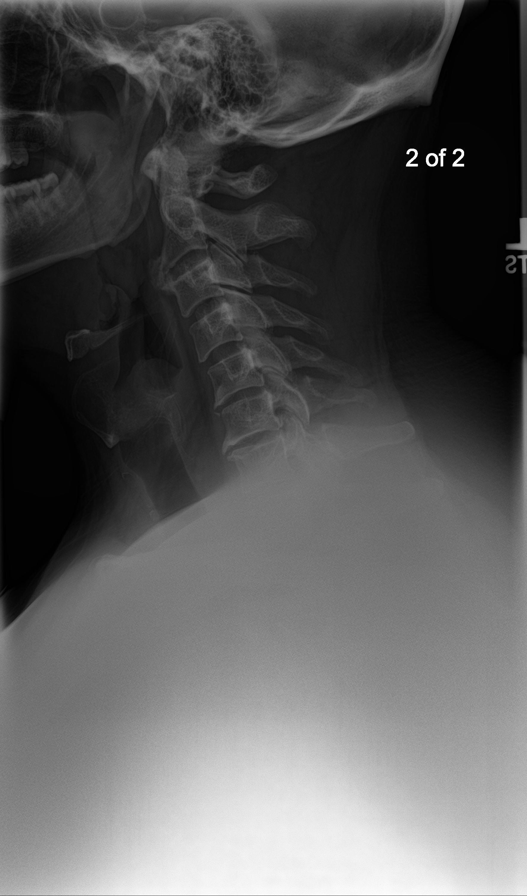
[im 3/7]
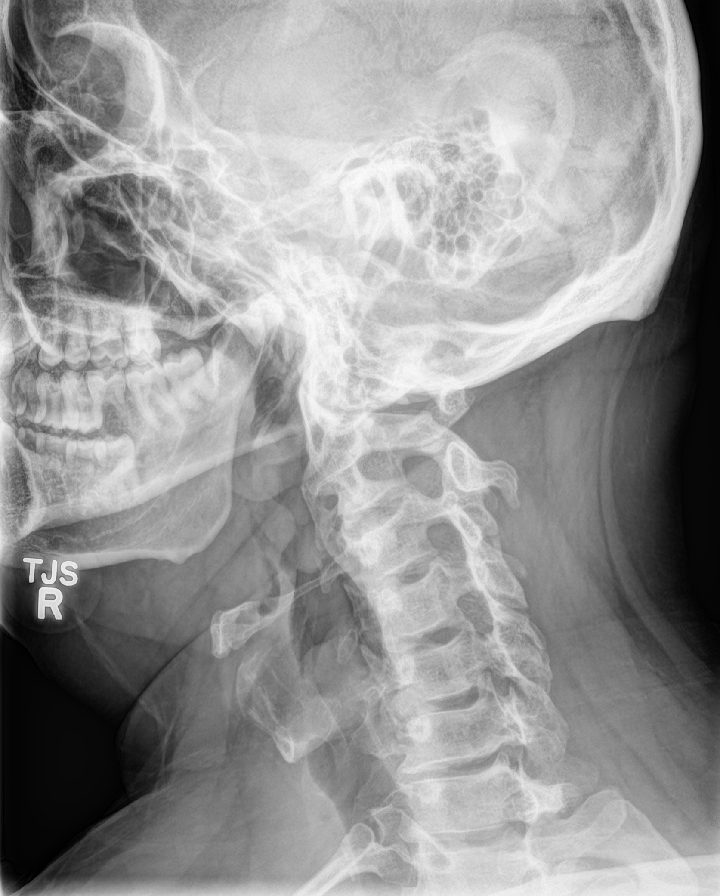
[im 4/7]
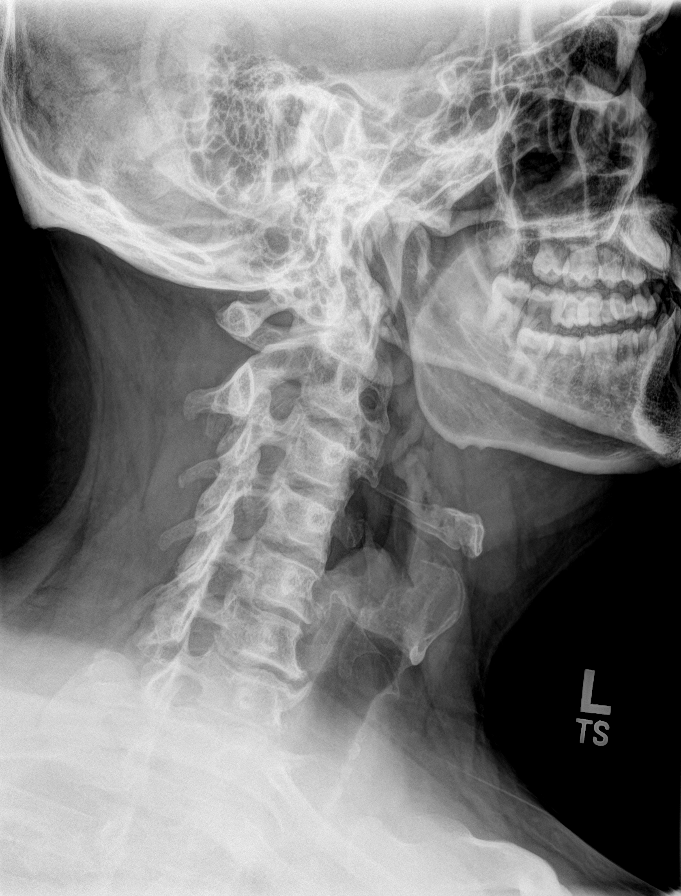
[im 5/7]
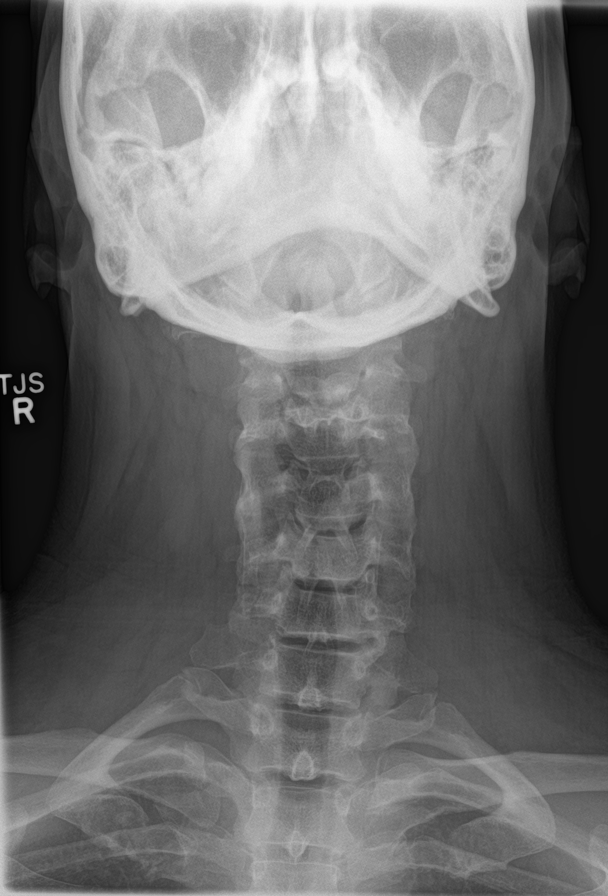
[im 6/7]
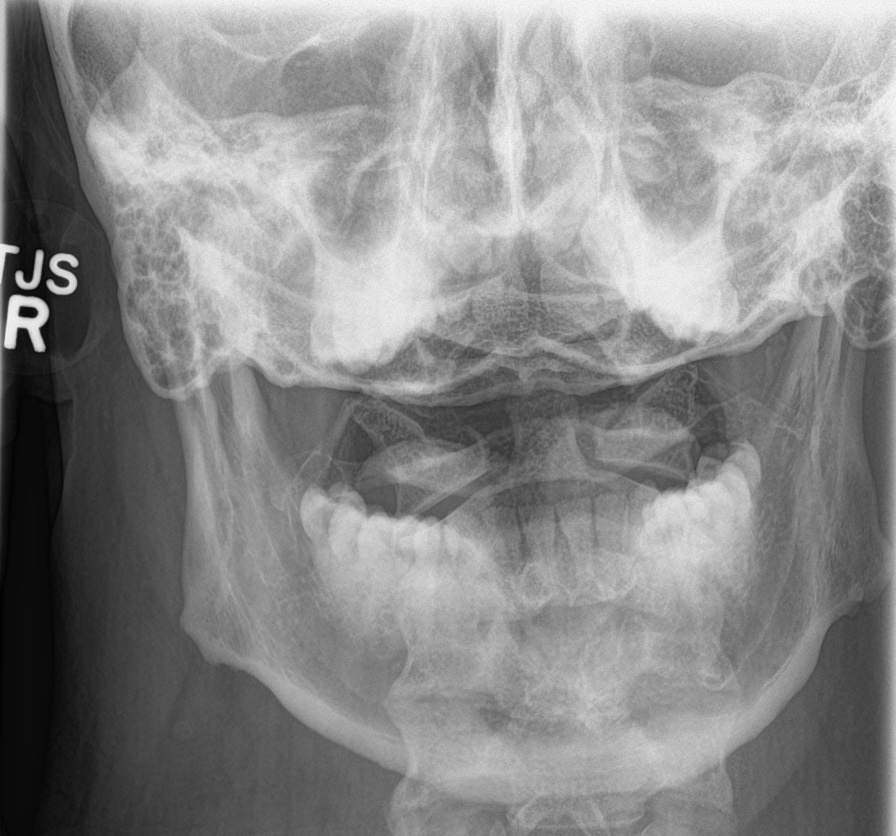
[im 7/7]
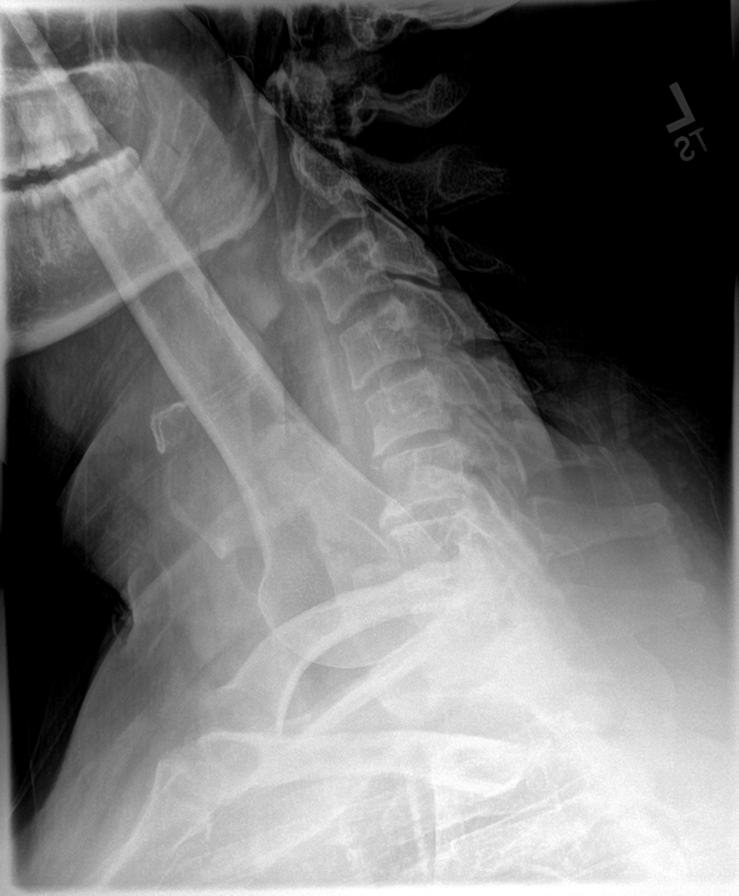

[7 of 7 positions shown; findings below may reference images not displayed]

FINDINGS: Cervical spine is visualized the skull base through the
cervicothoracic junction. There is straightening of the normal
cervical lordosis. AP alignment is anatomic. Vertebral body heights
are preserved.

Mild uncovertebral spurring is noted at C5-6 and C6-7 without
definite stenosis. Anterior osteophyte fusion is present at C2-3.

No acute fracture traumatic subluxation is present.
IMPRESSION: 1. No acute abnormality.
2. Mild degenerative changes are most evident at C5-6 and C6-7
without definite stenosis.

## 2020-06-03 IMAGING — DX DG CHEST 1V PORT
1 series · 1 of 1 positions shown · non-contrast
Comparison: None.

CLINICAL DATA: Endotracheal tube placement

EXAM:
PORTABLE CHEST 1 VIEW

[chest ap]
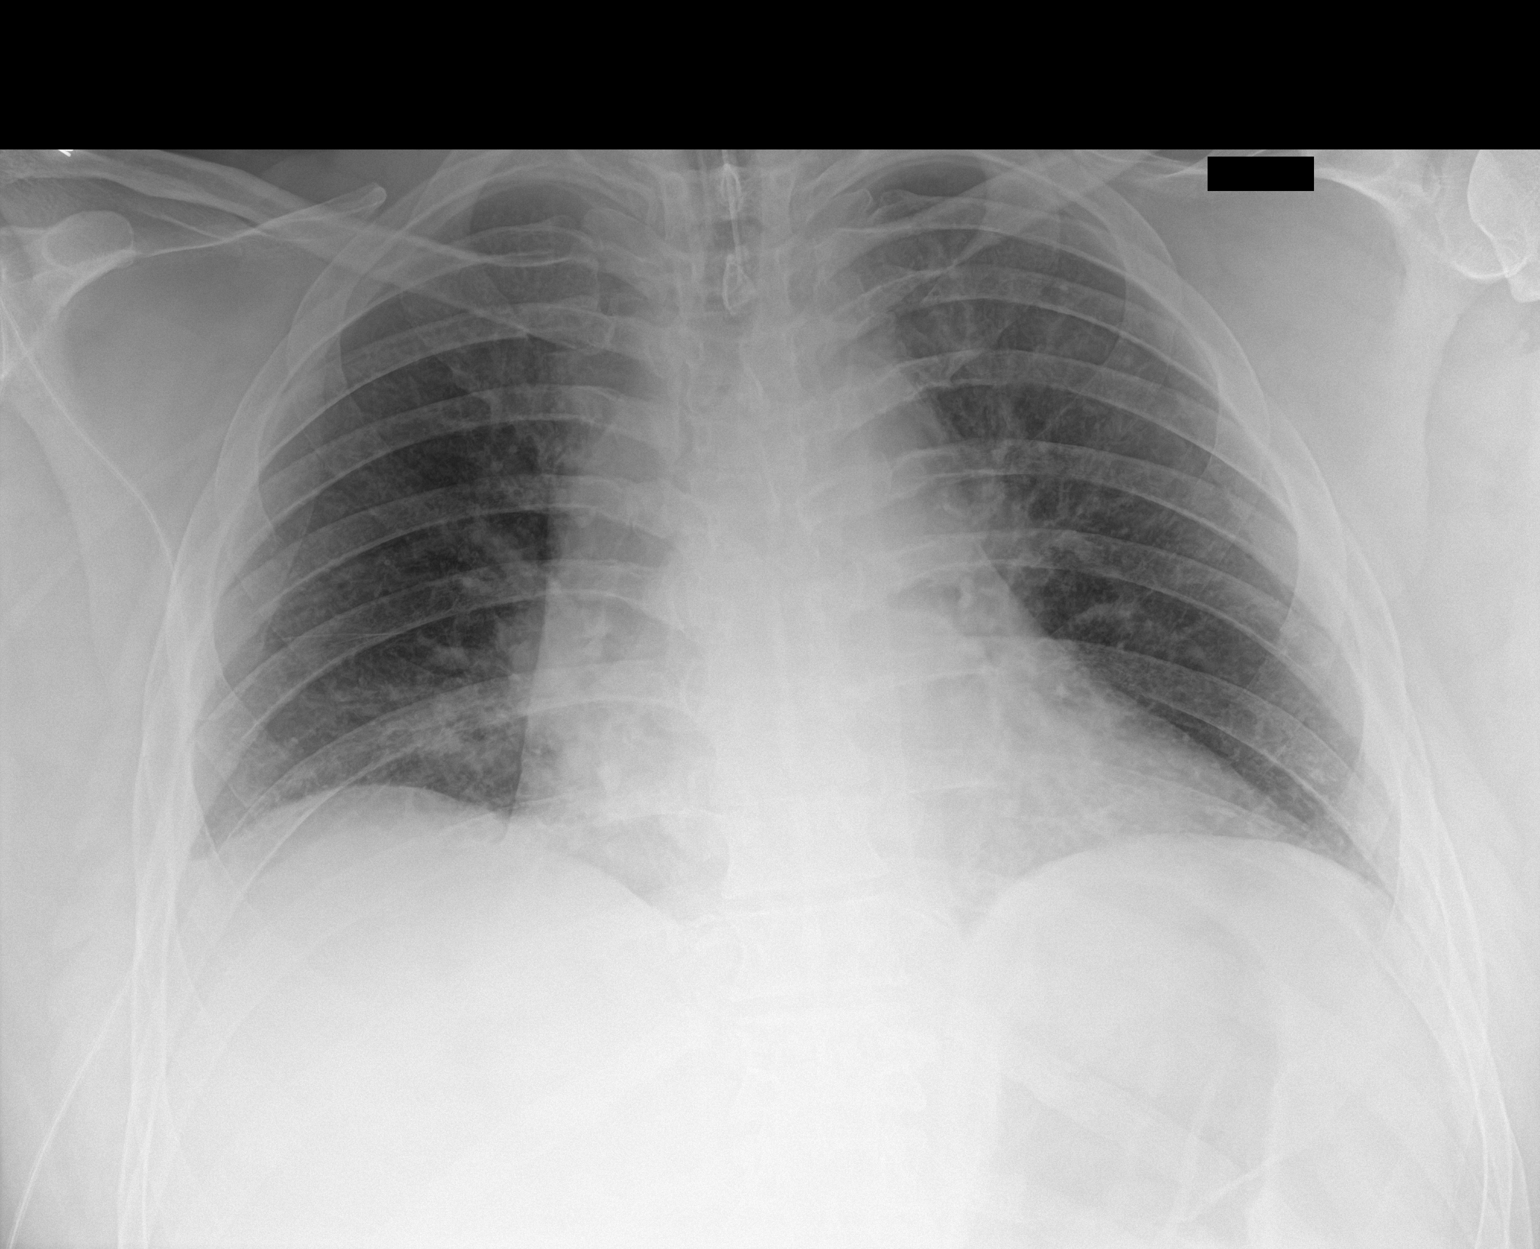

[1 of 1 positions shown; findings below may reference images not displayed]

FINDINGS: 2210 hours. Endotracheal tube tip 6.2 cm above the base of the
carina. Low lung volumes. The cardio pericardial silhouette is
enlarged. There is pulmonary vascular congestion without overt
pulmonary edema. The visualized bony structures of the thorax are
intact.
IMPRESSION: 1. Endotracheal tube tip 6.2 cm above the base of the carina.
2. Cardiomegaly with vascular congestion.

## 2022-07-07 ENCOUNTER — Encounter: Payer: Self-pay | Admitting: Emergency Medicine

## 2022-07-07 ENCOUNTER — Other Ambulatory Visit: Payer: Self-pay

## 2022-07-07 ENCOUNTER — Emergency Department: Payer: BC Managed Care – PPO

## 2022-07-07 ENCOUNTER — Emergency Department
Admission: EM | Admit: 2022-07-07 | Discharge: 2022-07-07 | Disposition: A | Discharge status: Home / Self Care | Payer: BC Managed Care – PPO | Attending: Emergency Medicine | Admitting: Emergency Medicine

## 2022-07-07 DIAGNOSIS — S37892A Contusion of other urinary and pelvic organ, initial encounter: Secondary | ICD-10-CM | POA: Insufficient documentation

## 2022-07-07 DIAGNOSIS — R1030 Lower abdominal pain, unspecified: Secondary | ICD-10-CM

## 2022-07-07 DIAGNOSIS — N5082 Scrotal pain: Secondary | ICD-10-CM | POA: Diagnosis present

## 2022-07-07 DIAGNOSIS — Y92410 Unspecified street and highway as the place of occurrence of the external cause: Secondary | ICD-10-CM | POA: Diagnosis not present

## 2022-07-07 DIAGNOSIS — S3023XA Contusion of vagina and vulva, initial encounter: Secondary | ICD-10-CM

## 2022-07-07 LAB — URINALYSIS, ROUTINE W REFLEX MICROSCOPIC
Bilirubin Urine: NEGATIVE
Glucose, UA: NEGATIVE mg/dL
Hgb urine dipstick: NEGATIVE
Ketones, ur: NEGATIVE mg/dL
Leukocytes,Ua: NEGATIVE
Nitrite: NEGATIVE
Protein, ur: 30 mg/dL — AB
Specific Gravity, Urine: 1.025 (ref 1.005–1.030)
Squamous Epithelial / HPF: NONE SEEN /HPF (ref 0–5)
pH: 5 (ref 5.0–8.0)

## 2022-07-07 NOTE — ED Triage Notes (Signed)
Pt presents to the ED due to testicle pain. Pt states swelling to the scrotum and a knot present behind the scrotum. Pt NAD. Pt A&Ox4
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
## 2024-01-17 ENCOUNTER — Telehealth: Payer: Self-pay

## 2024-01-17 NOTE — Telephone Encounter (Signed)
 Appt has been scheduled to see Dr. Franchot, pt is aware. Understood Dr Gasper is not accepting new pts and he has not been seen in about 6 years.

## 2024-01-17 NOTE — Telephone Encounter (Signed)
 Copied from CRM #[id number]. Topic: Clinical - Medical Advice >> Jan 17, 2024 10:18 AM Charolett L wrote: Reason for CRM: Patient is requesting to be placed back with Dr. Gasper CB# [phone]

## 2024-02-08 ENCOUNTER — Ambulatory Visit

## 2024-02-08 VITALS — BP 115/65 | HR 65 | Temp 97.6°F | Wt 309.0 lb

## 2024-02-08 DIAGNOSIS — Z1159 Encounter for screening for other viral diseases: Secondary | ICD-10-CM | POA: Diagnosis not present

## 2024-02-08 DIAGNOSIS — Z136 Encounter for screening for cardiovascular disorders: Secondary | ICD-10-CM | POA: Diagnosis not present

## 2024-02-08 DIAGNOSIS — Z1211 Encounter for screening for malignant neoplasm of colon: Secondary | ICD-10-CM | POA: Diagnosis not present

## 2024-02-08 DIAGNOSIS — Z131 Encounter for screening for diabetes mellitus: Secondary | ICD-10-CM

## 2024-02-08 DIAGNOSIS — L4050 Arthropathic psoriasis, unspecified: Secondary | ICD-10-CM | POA: Insufficient documentation

## 2024-02-08 DIAGNOSIS — F17200 Nicotine dependence, unspecified, uncomplicated: Secondary | ICD-10-CM | POA: Insufficient documentation

## 2024-02-08 NOTE — Progress Notes (Signed)
 "   New patient visit   Patient: Justin [name]   DOB: 12/21/1970   54 y.o. Male  MRN: [id number] Visit Date: 02/08/2024  Today's healthcare provider: Isaiah DELENA Pepper, MD   Chief Complaint  Patient presents with   Establish Care   Subjective    Justin [name] is a 54 y.o. male who presents today as a new patient to establish care.   Discussed the use of AI scribe software for clinical note transcription with the patient, who gave verbal consent to proceed.  History of Present Illness Justin [name] is a 54 year old male with psoriatic arthritis who presents for blood work.  He is currently taking Skyrizi for psoriatic arthritis, which he finds to be the most effective treatment among the three different medications he has tried. He also uses Flonase occasionally for sinus issues, particularly during spring and fall, and has not required additional allergy medications in the past year and a half. He occasionally takes Motrin for headaches.  He reports elevated blood pressure and notes recent stress related to moving and work. He measured his blood pressure at work, noting reading of 152/78 on January 2nd. He is concerned about potential causes of elevated blood pressure, such as electrolyte imbalances or cholesterol issues, and is seeking blood work to investigate further.  He has a history of a tonsillectomy in 2020, which resulted in complications including a blood clot and significant blood loss, requiring emergency medical intervention.  He smokes about a pack of cigarettes a day but is attempting to reduce his consumption. He previously quit smoking before his surgery but resumed shortly after. He has tried various methods to quit, including Chantix  and nicotine gum, but has not found them effective.  He has not had a colonoscopy and is considering non-invasive screening options for colon cancer.  He has not received a flu shot in recent years due to adverse reactions in  the past and has never had a COVID-19 vaccine.    Past Medical History:  Diagnosis Date   Difficult intubation    small mouth opening, short thyromental distance, difficult mask ventilation w/ OPA   GERD (gastroesophageal reflux disease)    History of chickenpox    Multilevel degenerative disc disease    RA   Obstructive sleep apnea    Sleep study over 12 yrs ago. No CPAP fo over 5 yrs - kept getting infections   Psoriasis    Past Surgical History:  Procedure Laterality Date   LARYNGOSCOPY  03/08/2018   Procedure: LARYNGOSCOPY;  Surgeon: Milissa Hamming, MD;  Location: [facility] OR[facility];  Service: ENT;;   TONSILLECTOMY Bilateral 03/06/2018   Procedure: TONSILLECTOMY;  Surgeon: Herminio Miu, MD;  Location: [facility];  Service: ENT;  Laterality: Bilateral;   TONSILLECTOMY Bilateral 03/08/2018   Procedure: Control of Post-tonsillectomy hemorrhage;  Surgeon: Milissa Hamming, MD;  Location: [facility] OR[facility];  Service: ENT;  Laterality: Bilateral;   UVULOPALATOPHARYNGOPLASTY N/A 03/06/2018   Procedure: UVULOPALATOPHARYNGOPLASTY (UPPP);  Surgeon: Herminio Miu, MD;  Location: [facility];  Service: ENT;  Laterality: N/A;   Family Status  Relation Name Status   Mother  Alive   Father  Other       health status unknown   Brother  Alive  No partnership data on file   Family History  Problem Relation Age of Onset   Hypertension Mother    Social History   Socioeconomic History   Marital status: Divorced    Spouse name: Not on file  Number of children: 1   Years of education: Not on file   Highest education level: Associate degree: occupational, technical, or vocational program  Occupational History   Not on file  Tobacco Use   Smoking status: Every Day    Current packs/day: 0.50    Average packs/day: 0.5 packs/day for 25.0 years (12.5 ttl pk-yrs)    Types: Cigarettes   Smokeless tobacco: Never   Tobacco comments:    since 1995. down to 0.5 PPD from 1 PPD  Vaping Use   Vaping status:  Never Used  Substance and Sexual Activity   Alcohol use: No   Drug use: Yes    Types: Marijuana    Comment: evenings - to help with sleep   Sexual activity: Not on file  Other Topics Concern   Not on file  Social History Narrative   Not on file   Social Drivers of Health   Tobacco Use: High Risk (02/08/2024)   Patient History    Smoking Tobacco Use: Every Day    Smokeless Tobacco Use: Never    Passive Exposure: Not on file  Financial Resource Strain: Low Risk (02/05/2024)   Overall Financial Resource Strain (CARDIA)    Difficulty of Paying Living Expenses: Not hard at all  Food Insecurity: No Food Insecurity (02/05/2024)   Epic    Worried About Programme Researcher, Broadcasting/film/video in the Last Year: Never true    Ran Out of Food in the Last Year: Never true  Transportation Needs: No Transportation Needs (02/05/2024)   Epic    Lack of Transportation (Medical): No    Lack of Transportation (Non-Medical): No  Physical Activity: Inactive (02/05/2024)   Exercise Vital Sign    Days of Exercise per Week: 0 days    Minutes of Exercise per Session: Not on file  Stress: Stress Concern Present (02/05/2024)   [employer] of Occupational Health - Occupational Stress Questionnaire    Feeling of Stress: Very much  Social Connections: Moderately Integrated (02/05/2024)   Social Connection and Isolation Panel    Frequency of Communication with Friends and Family: More than three times a week    Frequency of Social Gatherings with Friends and Family: More than three times a week    Attends Religious Services: More than 4 times per year    Active Member of Clubs or Organizations: Yes    Attends Banker Meetings: More than 4 times per year    Marital Status: Divorced  Depression (PHQ2-9): Low Risk (02/08/2024)   Depression (PHQ2-9)    PHQ-2 Score: 1  Alcohol Screen: Not on file  Housing: Unknown (02/05/2024)   Epic    Unable to Pay for Housing in the Last Year: No    Number of Times Moved in  the Last Year: Not on file    Homeless in the Last Year: No  Utilities: Not on file  Health Literacy: Not on file   Show/hide medication list[1] Allergies[2]  Reviews of Systems as noted in HPI.      Objective    BP 115/65   Pulse 65   Temp 97.6 F (36.4 C) (Oral)   Wt (!) 309 lb (140.2 kg)   SpO2 95%   BMI 38.62 kg/m     Physical Exam Constitutional:      Appearance: Normal appearance.  HENT:     Head: Normocephalic and atraumatic.     Mouth/Throat:     Mouth: Mucous membranes are moist.  Eyes:  Pupils: Pupils are equal, round, and reactive to light.  Cardiovascular:     Rate and Rhythm: Normal rate and regular rhythm.     Heart sounds: Normal heart sounds.  Pulmonary:     Effort: Pulmonary effort is normal.     Breath sounds: Normal breath sounds.  Skin:    General: Skin is warm.  Neurological:     General: No focal deficit present.     Mental Status: He is alert.     Depression Screen    02/08/2024   10:56 AM 09/20/2016    3:29 PM  PHQ 2/9 Scores  PHQ - 2 Score 0 0  PHQ- 9 Score 1    No results found for any visits on 02/08/24.  Assessment & Plan      Problem List Items Addressed This Visit       Musculoskeletal and Integument   Psoriatic arthritis (HCC) - Primary   Relevant Medications   SKYRIZI PEN 150 MG/ML pen   Other Relevant Orders   Comprehensive metabolic panel with GFR   CBC with Differential/Platelet     Other   Tobacco use disorder   Other Visit Diagnoses       Screening for colon cancer       Relevant Orders   Cologuard     Screening for cardiovascular condition       Relevant Orders   Lipid panel     Screening for diabetes mellitus (DM)       Relevant Orders   Hemoglobin A1c     Need for hepatitis C screening test       Relevant Orders   Hepatitis C antibody      Assessment & Plan Psoriatic arthritis (HCC) Chronic, well-managed with Skyrizi. Follows with rheumatology. Due for monitoring labs. Will obtain  CBC, CMP today.  Tobacco use disorder Currently smoking a pack a day, reduced from a pack and a half. Motivated to quit. Previous attempts with Chantix  and gum unsuccessful. Discussed health risks.  - Counseled patient on smoking cessation for 5 minutes. Utilized the 5 As to encourage patient to quit smoking.  - Offered nicotine patches, will obtain OTC  Screening for colon cancer Due for screening. Discussed colonoscopy and Cologuard. - Ordered Cologuard test.  Screening for diabetes mellitus Screening for cardiovascular condition Blood pressure normal today. - Ordered A1c test and lipid panel  Screening for hepatitis C Due for screening. - Ordered hepatitis C screening blood test.    Return in about 1 year (around 02/07/2025) for Annual Physical Exam.      Isaiah DELENA Pepper, MD  [facility] [phone] (phone) [phone] (fax)     [1]  Outpatient Medications Prior to Visit  Medication Sig   fluticasone (FLONASE) 50 MCG/ACT nasal spray Place 2 sprays into both nostrils daily. (Patient taking differently: Place 2 sprays into both nostrils as needed for allergies.)   SKYRIZI PEN 150 MG/ML pen Inject 150 mg into the skin every 4 (four) months.   [DISCONTINUED] acetaminophen  (TYLENOL ) 160 MG/5ML solution Take 500 mg by mouth every 4 (four) hours as needed for fever.   [DISCONTINUED] Adalimumab (HUMIRA) 40 MG/0.4ML PSKT Inject 40 mg into the skin every 14 (fourteen) days.   [DISCONTINUED] clindamycin  (CLEOCIN ) 300 MG capsule Take 1 capsule (300 mg total) by mouth 3 (three) times daily.   [DISCONTINUED] docusate sodium  (COLACE) 100 MG capsule Take 1 capsule (100 mg total) by mouth 2 (two) times daily.   [DISCONTINUED] lidocaine  (  XYLOCAINE ) 2 % solution Use as directed 10 mLs in the mouth or throat every 6 (six) hours as needed for mouth pain (Swish and spit).   [DISCONTINUED] omeprazole (PRILOSEC) 40 MG capsule Take 40 mg by mouth daily as needed  (heartburn/indigestion.).    [DISCONTINUED] ondansetron  (ZOFRAN ) 4 MG tablet Take 1 tablet (4 mg total) by mouth every 8 (eight) hours as needed for up to 10 doses for nausea or vomiting.   [DISCONTINUED] oxyCODONE  (ROXICODONE ) 5 MG/5ML solution Take 10 mLs (10 mg total) by mouth every 6 (six) hours as needed for severe pain.   No facility-administered medications prior to visit.  [2]  Allergies Allergen Reactions   Penicillins Other (See Comments)    Unknown childhood reaction.  Reports has taken amoxicillin  without issues Did it involve swelling of the face/tongue/throat, SOB, or low BP? Unknown Did it involve sudden or severe rash/hives, skin peeling, or any reaction on the inside of your mouth or nose? Unknown Did you need to seek medical attention at a hospital or doctor's office? Yes When did it last happen? Childhood reaction        If all above answers are NO, may proceed with cephalosporin use.    "

## 2024-02-09 ENCOUNTER — Ambulatory Visit: Payer: Self-pay

## 2024-02-09 LAB — LIPID PANEL
Chol/HDL Ratio: 5.1 ratio — ABNORMAL HIGH (ref 0.0–5.0)
Cholesterol, Total: 172 mg/dL (ref 100–199)
HDL: 34 mg/dL — ABNORMAL LOW
LDL Chol Calc (NIH): 121 mg/dL — ABNORMAL HIGH (ref 0–99)
Triglycerides: 89 mg/dL (ref 0–149)
VLDL Cholesterol Cal: 17 mg/dL (ref 5–40)

## 2024-02-09 LAB — COMPREHENSIVE METABOLIC PANEL WITH GFR
ALT: 12 IU/L (ref 0–44)
AST: 13 IU/L (ref 0–40)
Albumin: 4.2 g/dL (ref 3.8–4.9)
Alkaline Phosphatase: 96 IU/L (ref 47–123)
BUN/Creatinine Ratio: 11 (ref 9–20)
BUN: 10 mg/dL (ref 6–24)
Bilirubin Total: 0.5 mg/dL (ref 0.0–1.2)
CO2: 21 mmol/L (ref 20–29)
Calcium: 9.3 mg/dL (ref 8.7–10.2)
Chloride: 103 mmol/L (ref 96–106)
Creatinine, Ser: 0.88 mg/dL (ref 0.76–1.27)
Globulin, Total: 3.2 g/dL (ref 1.5–4.5)
Glucose: 96 mg/dL (ref 70–99)
Potassium: 4.3 mmol/L (ref 3.5–5.2)
Sodium: 140 mmol/L (ref 134–144)
Total Protein: 7.4 g/dL (ref 6.0–8.5)
eGFR: 103 mL/min/1.73

## 2024-02-09 LAB — CBC WITH DIFFERENTIAL/PLATELET
Basophils Absolute: 0.1 x10E3/uL (ref 0.0–0.2)
Basos: 1 %
EOS (ABSOLUTE): 0.3 x10E3/uL (ref 0.0–0.4)
Eos: 4 %
Hematocrit: 44.7 % (ref 37.5–51.0)
Hemoglobin: 15 g/dL (ref 13.0–17.7)
Immature Grans (Abs): 0 x10E3/uL (ref 0.0–0.1)
Immature Granulocytes: 0 %
Lymphocytes Absolute: 4 x10E3/uL — ABNORMAL HIGH (ref 0.7–3.1)
Lymphs: 42 %
MCH: 30.9 pg (ref 26.6–33.0)
MCHC: 33.6 g/dL (ref 31.5–35.7)
MCV: 92 fL (ref 79–97)
Monocytes Absolute: 0.5 x10E3/uL (ref 0.1–0.9)
Monocytes: 6 %
Neutrophils Absolute: 4.5 x10E3/uL (ref 1.4–7.0)
Neutrophils: 47 %
Platelets: 373 x10E3/uL (ref 150–450)
RBC: 4.86 x10E6/uL (ref 4.14–5.80)
RDW: 13.1 % (ref 11.6–15.4)
WBC: 9.5 x10E3/uL (ref 3.4–10.8)

## 2024-02-09 LAB — HEPATITIS C ANTIBODY: Hep C Virus Ab: NONREACTIVE

## 2024-02-09 LAB — HEMOGLOBIN A1C
Est. average glucose Bld gHb Est-mCnc: 114 mg/dL
Hgb A1c MFr Bld: 5.6 % (ref 4.8–5.6)

## 2025-02-11 ENCOUNTER — Encounter
